# Patient Record
Sex: Male | Born: 1965 | Race: Black or African American | Hispanic: No | Marital: Single | Smoking: Never smoker
Health system: Southern US, Community
[De-identification: ages and names within clinical notes are randomized; demographics above are authoritative.]

## PROBLEM LIST (undated history)

## (undated) DIAGNOSIS — I639 Cerebral infarction, unspecified: Secondary | ICD-10-CM

## (undated) DIAGNOSIS — K259 Gastric ulcer, unspecified as acute or chronic, without hemorrhage or perforation: Secondary | ICD-10-CM

## (undated) DIAGNOSIS — L409 Psoriasis, unspecified: Secondary | ICD-10-CM

## (undated) DIAGNOSIS — M109 Gout, unspecified: Secondary | ICD-10-CM

## (undated) DIAGNOSIS — D696 Thrombocytopenia, unspecified: Secondary | ICD-10-CM

## (undated) DIAGNOSIS — I10 Essential (primary) hypertension: Secondary | ICD-10-CM

## (undated) DIAGNOSIS — M1990 Unspecified osteoarthritis, unspecified site: Secondary | ICD-10-CM

## (undated) HISTORY — PX: NO PAST SURGERIES: SHX2092

---

## 2012-08-11 ENCOUNTER — Inpatient Hospital Stay (HOSPITAL_COMMUNITY)
Admission: EM | Admit: 2012-08-11 | Discharge: 2012-08-13 | DRG: 378 | Attending: Internal Medicine | Admitting: Internal Medicine

## 2012-08-11 ENCOUNTER — Encounter (HOSPITAL_COMMUNITY): Payer: Self-pay | Admitting: *Deleted

## 2012-08-11 ENCOUNTER — Emergency Department (HOSPITAL_COMMUNITY)

## 2012-08-11 DIAGNOSIS — L408 Other psoriasis: Secondary | ICD-10-CM | POA: Diagnosis present

## 2012-08-11 DIAGNOSIS — K922 Gastrointestinal hemorrhage, unspecified: Secondary | ICD-10-CM | POA: Diagnosis present

## 2012-08-11 DIAGNOSIS — K264 Chronic or unspecified duodenal ulcer with hemorrhage: Principal | ICD-10-CM | POA: Diagnosis present

## 2012-08-11 DIAGNOSIS — K648 Other hemorrhoids: Secondary | ICD-10-CM | POA: Diagnosis present

## 2012-08-11 DIAGNOSIS — K222 Esophageal obstruction: Secondary | ICD-10-CM | POA: Diagnosis present

## 2012-08-11 DIAGNOSIS — I10 Essential (primary) hypertension: Secondary | ICD-10-CM | POA: Diagnosis present

## 2012-08-11 DIAGNOSIS — D62 Acute posthemorrhagic anemia: Secondary | ICD-10-CM | POA: Diagnosis present

## 2012-08-11 DIAGNOSIS — K254 Chronic or unspecified gastric ulcer with hemorrhage: Secondary | ICD-10-CM | POA: Diagnosis present

## 2012-08-11 DIAGNOSIS — E876 Hypokalemia: Secondary | ICD-10-CM | POA: Diagnosis present

## 2012-08-11 DIAGNOSIS — D649 Anemia, unspecified: Secondary | ICD-10-CM

## 2012-08-11 DIAGNOSIS — K921 Melena: Secondary | ICD-10-CM

## 2012-08-11 DIAGNOSIS — K5730 Diverticulosis of large intestine without perforation or abscess without bleeding: Secondary | ICD-10-CM | POA: Diagnosis present

## 2012-08-11 DIAGNOSIS — Z872 Personal history of diseases of the skin and subcutaneous tissue: Secondary | ICD-10-CM

## 2012-08-11 HISTORY — DX: Psoriasis, unspecified: L40.9

## 2012-08-11 HISTORY — DX: Essential (primary) hypertension: I10

## 2012-08-11 HISTORY — DX: Gout, unspecified: M10.9

## 2012-08-11 LAB — CBC
HCT: 27.6 % — ABNORMAL LOW (ref 39.0–52.0)
HCT: 21.1 % — ABNORMAL LOW (ref 39.0–52.0)
HCT: 21.4 % — ABNORMAL LOW (ref 39.0–52.0)
Hemoglobin: 9.3 g/dL — ABNORMAL LOW (ref 13.0–17.0)
Hemoglobin: 7.1 g/dL — ABNORMAL LOW (ref 13.0–17.0)
MCH: 25.7 pg — ABNORMAL LOW (ref 26.0–34.0)
MCH: 25.4 pg — ABNORMAL LOW (ref 26.0–34.0)
MCHC: 33.6 g/dL (ref 30.0–36.0)
MCV: 77.5 fL — ABNORMAL LOW (ref 78.0–100.0)
RBC: 3.6 MIL/uL — ABNORMAL LOW (ref 4.22–5.81)
RBC: 2.76 MIL/uL — ABNORMAL LOW (ref 4.22–5.81)
RBC: 2.76 MIL/uL — ABNORMAL LOW (ref 4.22–5.81)
WBC: 12.3 10*3/uL — ABNORMAL HIGH (ref 4.0–10.5)
WBC: 8.5 10*3/uL (ref 4.0–10.5)

## 2012-08-11 LAB — COMPREHENSIVE METABOLIC PANEL
ALT: 12 U/L (ref 0–53)
Alkaline Phosphatase: 92 U/L (ref 39–117)
CO2: 24 mEq/L (ref 19–32)
Chloride: 103 mEq/L (ref 96–112)
GFR calc Af Amer: 89 mL/min — ABNORMAL LOW (ref >90)
GFR calc non Af Amer: 76 mL/min — ABNORMAL LOW (ref >90)
Glucose, Bld: 133 mg/dL — ABNORMAL HIGH (ref 70–99)
Potassium: 3.2 mEq/L — ABNORMAL LOW (ref 3.5–5.1)
Sodium: 140 mEq/L (ref 135–145)
Total Bilirubin: 0.3 mg/dL (ref 0.3–1.2)
Total Protein: 7.8 g/dL (ref 6.0–8.3)

## 2012-08-11 LAB — FERRITIN: Ferritin: 126 ng/mL (ref 22–322)

## 2012-08-11 LAB — URINALYSIS, ROUTINE W REFLEX MICROSCOPIC
Bilirubin Urine: NEGATIVE
Ketones, ur: NEGATIVE mg/dL
Nitrite: NEGATIVE
Protein, ur: NEGATIVE mg/dL
pH: 5 (ref 5.0–8.0)

## 2012-08-11 LAB — IRON AND TIBC
Iron: 89 ug/dL (ref 42–135)
Saturation Ratios: 41 % (ref 20–55)
TIBC: 219 ug/dL (ref 215–435)
UIBC: 130 ug/dL (ref 125–400)

## 2012-08-11 LAB — RETICULOCYTES: Retic Ct Pct: 2.1 % (ref 0.4–3.1)

## 2012-08-11 MED ORDER — PANTOPRAZOLE SODIUM 40 MG IV SOLR
INTRAVENOUS | Status: AC
  Filled 2012-08-11: qty 40

## 2012-08-11 MED ORDER — HYDROCERIN EX CREA
TOPICAL_CREAM | CUTANEOUS | Status: DC | PRN
  Filled 2012-08-11: qty 113

## 2012-08-11 MED ORDER — BIOTENE DRY MOUTH MT LIQD
15.0000 mL | Freq: Two times a day (BID) | OROMUCOSAL | Status: DC
  Administered 2012-08-11 – 2012-08-13 (×5): 15 mL via OROMUCOSAL

## 2012-08-11 MED ORDER — ONDANSETRON HCL 4 MG PO TABS: 4.0000 mg | ORAL_TABLET | Freq: Four times a day (QID) | ORAL | Status: DC | PRN

## 2012-08-11 MED ORDER — SODIUM CHLORIDE 0.9 % IV SOLN: INTRAVENOUS | Status: DC

## 2012-08-11 MED ORDER — METHOTREXATE 2.5 MG PO TABS
20.0000 mg | ORAL_TABLET | ORAL | Status: DC
  Administered 2012-08-11: 20 mg via ORAL
  Filled 2012-08-11: qty 8

## 2012-08-11 MED ORDER — IOHEXOL 300 MG/ML  SOLN
100.0000 mL | Freq: Once | INTRAMUSCULAR | Status: AC | PRN
  Administered 2012-08-11: 100 mL via INTRAVENOUS

## 2012-08-11 MED ORDER — FOLIC ACID 1 MG PO TABS
1.0000 mg | ORAL_TABLET | Freq: Every day | ORAL | Status: DC
  Filled 2012-08-11: qty 1

## 2012-08-11 MED ORDER — DIPHENHYDRAMINE HCL 25 MG PO CAPS: 25.0000 mg | ORAL_CAPSULE | Freq: Every evening | ORAL | Status: DC | PRN

## 2012-08-11 MED ORDER — LISINOPRIL 10 MG PO TABS
40.0000 mg | ORAL_TABLET | Freq: Every day | ORAL | Status: DC
  Administered 2012-08-11: 40 mg via ORAL
  Filled 2012-08-11: qty 4

## 2012-08-11 MED ORDER — POTASSIUM CHLORIDE 10 MEQ/100ML IV SOLN
10.0000 meq | Freq: Once | INTRAVENOUS | Status: AC
  Administered 2012-08-11: 10 meq via INTRAVENOUS
  Filled 2012-08-11: qty 100

## 2012-08-11 MED ORDER — INFLUENZA VIRUS VACC SPLIT PF IM SUSP
0.5000 mL | INTRAMUSCULAR | Status: AC
Start: 2012-08-12 — End: 2012-08-12
  Administered 2012-08-12: 0.5 mL via INTRAMUSCULAR
  Filled 2012-08-11: qty 0.5

## 2012-08-11 MED ORDER — PANTOPRAZOLE SODIUM 40 MG IV SOLR
40.0000 mg | Freq: Once | INTRAVENOUS | Status: AC
  Administered 2012-08-11: 40 mg via INTRAVENOUS

## 2012-08-11 MED ORDER — PEG 3350-KCL-NABCB-NACL-NASULF 236 G PO SOLR
4000.0000 mL | Freq: Once | ORAL | Status: AC
  Administered 2012-08-11: 4000 mL via ORAL
  Filled 2012-08-11: qty 4000

## 2012-08-11 MED ORDER — SODIUM CHLORIDE 0.9 % IV SOLN
INTRAVENOUS | Status: DC
  Administered 2012-08-11: 1000 mL via INTRAVENOUS
  Administered 2012-08-11 – 2012-08-12 (×2): via INTRAVENOUS

## 2012-08-11 MED ORDER — FOLIC ACID 1 MG PO TABS
1.0000 mg | ORAL_TABLET | ORAL | Status: DC
  Administered 2012-08-12 – 2012-08-13 (×2): 1 mg via ORAL
  Filled 2012-08-11 (×2): qty 1

## 2012-08-11 MED ORDER — ONDANSETRON HCL 4 MG/2ML IJ SOLN: 4.0000 mg | Freq: Four times a day (QID) | INTRAMUSCULAR | Status: DC | PRN

## 2012-08-11 MED ORDER — AMLODIPINE BESYLATE 5 MG PO TABS
5.0000 mg | ORAL_TABLET | Freq: Every day | ORAL | Status: DC
  Administered 2012-08-11: 5 mg via ORAL
  Filled 2012-08-11: qty 1

## 2012-08-11 MED ORDER — TRIAMCINOLONE ACETONIDE 0.1 % EX CREA
TOPICAL_CREAM | Freq: Two times a day (BID) | CUTANEOUS | Status: DC
  Administered 2012-08-11 (×2): via TOPICAL
  Administered 2012-08-12 (×2): 1 via TOPICAL
  Administered 2012-08-13: 10:00:00 via TOPICAL
  Filled 2012-08-11: qty 15

## 2012-08-11 MED ORDER — TRIAMTERENE-HCTZ 75-50 MG PO TABS
1.0000 | ORAL_TABLET | Freq: Every day | ORAL | Status: DC
  Administered 2012-08-11: 1 via ORAL
  Filled 2012-08-11: qty 1

## 2012-08-11 MED ORDER — ONDANSETRON HCL 4 MG/2ML IJ SOLN
4.0000 mg | Freq: Once | INTRAMUSCULAR | Status: AC
  Administered 2012-08-11: 4 mg via INTRAVENOUS
  Filled 2012-08-11: qty 2

## 2012-08-11 MED ORDER — CARVEDILOL 12.5 MG PO TABS
12.5000 mg | ORAL_TABLET | Freq: Two times a day (BID) | ORAL | Status: DC
  Administered 2012-08-11 – 2012-08-13 (×3): 12.5 mg via ORAL
  Filled 2012-08-11 (×3): qty 1

## 2012-08-11 MED ORDER — HYDROMORPHONE HCL PF 1 MG/ML IJ SOLN
1.0000 mg | Freq: Once | INTRAMUSCULAR | Status: AC
  Administered 2012-08-11: 1 mg via INTRAVENOUS
  Filled 2012-08-11: qty 1

## 2012-08-11 MED ORDER — POTASSIUM CHLORIDE CRYS ER 20 MEQ PO TBCR
40.0000 meq | EXTENDED_RELEASE_TABLET | Freq: Once | ORAL | Status: AC
  Administered 2012-08-11: 40 meq via ORAL
  Filled 2012-08-11: qty 2

## 2012-08-11 MED ORDER — SODIUM CHLORIDE 0.9 % IJ SOLN
3.0000 mL | Freq: Two times a day (BID) | INTRAMUSCULAR | Status: DC
  Administered 2012-08-12 – 2012-08-13 (×2): 3 mL via INTRAVENOUS
  Filled 2012-08-11 (×3): qty 3
  Filled 2012-08-11: qty 6
  Filled 2012-08-11 (×2): qty 3

## 2012-08-11 NOTE — Progress Notes (Signed)
UR Chart Review Completed  

## 2012-08-11 NOTE — Progress Notes (Signed)
Nutrition Brief Note  Patient identified on the Malnutrition Screening Tool (MST) report for weight loss and eating poorly, generating a score of 2.  Wt Readings from Last 10 Encounters:  08/11/12 195 lb 8 oz (88.678 kg)   Pt is an inmate at [facility]. He reports that last time he weighed himself was about 8 months ago and reported a weight of 200#. He ate oysters on Friday and a burger and Ramen noodles on Saturday. He follows a regular diet and had a good appetite up until Saturday night.   Body mass index is 27.27 kg/(m^2). Pt meets criteria for overweight based on current BMI.   Current diet order is clear liquid, patient is consuming approximately 50-75% of meals at this time (per pt report). He reports good tolerance of clear liquids and is looking forward to upgrading his diet. He is scheduled for a colonoscopy tomorrow. Labs and medications reviewed.   No nutrition interventions warranted at this time. If nutrition issues arise, please consult RD.   Melody Haver, RD, LDN Pager: [phone]

## 2012-08-11 NOTE — H&P (Signed)
[facility] History and Physical  Wess Baney OZH:[id number] DOB: March 19, 1966 DOA: 08/11/2012 PCP: No primary provider on file.   Chief Complaint: Rectal bleeding.  HPI: [name] [name] is a 46 y.o. male who presents with the above symptoms for the last 2 days. Patient has been in prison for the last 22 years. He has not had these symptoms before. The rectal bleeding seems to be quite large and colored. It is associated with some left mid and lower quadrant abdominal pain. There is no nausea or vomiting. He tells me that he ate some oysters prior to this bleeding starting. He denies any significant diarrhea. There is no hematemesis. He denies the use of nonsteroidal anti-inflammatory medications. He does not drink alcohol, not allowed in prison.   Review of Systems:  All systems negative apart from history of present illness.  Past Medical History  Diagnosis Date  . Hypertension   . Gout   . Psoriasis    History reviewed. No pertinent past surgical history. Social History:  Is currently in prison. He does not smoke. Does not drink alcohol.   No Known Allergies   Prior to Admission medications   Medication Sig Start Date End Date Taking? Authorizing Provider  amLODipine (NORVASC) 5 MG tablet Take 5 mg by mouth daily.   Yes Historical Provider, MD  carvedilol (COREG) 12.5 MG tablet Take 12.5 mg by mouth 2 (two) times daily with a meal.   Yes Historical Provider, MD  diphenhydrAMINE (SOMINEX) 25 MG tablet Take 25 mg by mouth at bedtime as needed.   Yes Historical Provider, MD  folic acid (FOLVITE) 1 MG tablet Take 1 mg by mouth daily.   Yes Historical Provider, MD  lisinopril (PRINIVIL,ZESTRIL) 40 MG tablet Take 40 mg by mouth daily.   Yes Historical Provider, MD  methotrexate (RHEUMATREX) 2.5 MG tablet Take 2.5 mg by mouth once a week. Caution:Chemotherapy. Protect from light.   Yes Historical Provider, MD  Skin Protectants, Misc. (EUCERIN) cream Apply topically as needed.   Yes  Historical Provider, MD  triamcinolone cream (KENALOG) 0.1 % Apply topically 2 (two) times daily.   Yes Historical Provider, MD  triamterene-hydrochlorothiazide (MAXZIDE) 75-50 MG per tablet Take 1 tablet by mouth daily.   Yes Historical Provider, MD   Physical Exam: Filed Vitals:   08/11/12 0205 08/11/12 0457 08/11/12 0548  BP: 139/84 114/76 124/85  Pulse: 117 97 103  Temp: 97.9 F (36.6 C)  97.6 F (36.4 C)  TempSrc: Oral    Resp: 16 18 20   Height:   5\' 11"  (1.803 m)  Weight:   88.678 kg (195 lb 8 oz)  SpO2: 100% 100% 100%     General:  He looks systemically well. He does not look particularly pale. He does not look shock.  Eyes: No pallor. No jaundice.  ENT: No abnormalities.  Neck: No lymphadenopathy.  Cardiovascular: Heart sounds are present and normal without murmurs.  Respiratory: Lung fields are clear.  Abdomen: Soft, nontender, no hepatosplenomegaly.  Skin: Psoriatic lesions on his trunk.  Musculoskeletal: No abnormalities.  Psychiatric: Appropriate affect.  Neurologic: Alert and orientated without any focal neurological signs.  Labs on Admission:  Basic Metabolic Panel:  Lab 08/11/12 6295  NA 140  K 3.2*  CL 103  CO2 24  GLUCOSE 133*  BUN 39*  CREATININE 1.15  CALCIUM 9.0  MG --  PHOS --   Liver Function Tests:  Lab 08/11/12 0220  AST 18  ALT 12  ALKPHOS 92  BILITOT 0.3  PROT 7.8  ALBUMIN 3.3*     CBC:  Lab 08/11/12 0220  WBC 12.3*  NEUTROABS --  HGB 9.3*  HCT 27.6*  MCV 76.7*  PLT 261     Radiological Exams on Admission: Ct Abdomen Pelvis W Contrast  08/11/2012  *RADIOLOGY REPORT*  Clinical Data: Left side abdominal pain.  Blood in stool.  CT ABDOMEN AND PELVIS WITH CONTRAST  Technique:  Multidetector CT imaging of the abdomen and pelvis was performed following the standard protocol during bolus administration of intravenous contrast.  Contrast: OMNIPAQUE IOHEXOL 300 MG/ML  SOLN  Comparison: None.  Findings: Mild  dependent atelectasis is seen in the lung bases. There is no pleural or pericardial effusion.  A 0.6 cm low attenuating lesion in the dome of the liver is likely a cyst but cannot be definitively characterized.  The liver is otherwise unremarkable.  The gallbladder, adrenal glands, spleen, pancreas and right kidney appear normal.  Left renal cyst is noted. The left kidney otherwise appears normal.  The stomach and small and large bowel are unremarkable.  There is no lymphadenopathy or fluid.  Prostate gland, seminal vesicles and urinary bladder appear normal. 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 Central disc protrusion at L4-5 is identified.  IMPRESSION:  1.  No acute finding or finding to explain the patient's symptoms. 2.  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   Original Report Authenticated By: Bernadene Bell. Maricela Curet, M.D.       Assessment/Plan Active Problems:  GI bleed  H/O psoriasis  HTN (hypertension)   1. Rectal bleeding, unclear etiology. The microcytic anemia implies a more long-term process rather than an acute bleed. Therefore I am not convinced that his bleeding is related to eating oysters. 2. Hypertension , controlled. 3. Psoriasis on the methotrexate. 4.  Plan: 1. Admit to telemetry floor. 2. Monitor hemoglobin very closely. Patient is currently hemodynamically stable. No blood transfusion requirement at the moment unless he continues to bleed. 3. Gastrology consultation this morning with a view to colonoscopy. Further recommendations will depend on patient's hospital progress.  Code Status: Full code. Family Communication: Discussed plan with patient at the bedside. Disposition Plan: Back to prison once medically stable.  Time spent: 30 minutes.  Wilson Singer [facility] Pager [phone].  If 7PM-7AM, please contact night-coverage [email or web address] Password [facility] 08/11/2012, 7:28 AM

## 2012-08-11 NOTE — ED Provider Notes (Signed)
History     CSN: [id number]  Arrival date & time 08/11/12  4098   First MD Initiated Contact with Patient 08/11/12 0206      Chief Complaint  Patient presents with  . Rectal Bleeding  . Flank Pain  . Weakness    (Consider location/radiation/quality/duration/timing/severity/associated sxs/prior treatment) HPI HX per PT. Currently incarcerated for the last 22 years. Noticed BRRPR and today L lower ABD pain. No h/o same. Denies FB or rectal trauma. No rectal pain. No N/V. Some generalized weakness. Takes MTX for psoriasis. No change in medications, denies NSAIDs otherwise or new medications. Mod in severity, multiple stools with blood today.pain is cramping, worse with movement and not radiating, no hematuria. No F/C.  Past Medical History  Diagnosis Date  . Hypertension   . Gout     History reviewed. No pertinent past surgical history.  History reviewed. No pertinent family history.  History  Substance Use Topics  . Smoking status: Never Smoker   . Smokeless tobacco: Not on file  . Alcohol Use: No      Review of Systems  Constitutional: Negative for fever and chills.  HENT: Negative for neck pain and neck stiffness.   Eyes: Negative for pain.  Respiratory: Negative for shortness of breath.   Cardiovascular: Negative for chest pain.  Gastrointestinal: Positive for abdominal pain and blood in stool.  Genitourinary: Negative for dysuria.  Musculoskeletal: Negative for back pain.  Skin: Negative for rash.  Neurological: Negative for headaches.  All other systems reviewed and are negative.    Allergies  Review of patient's allergies indicates no known allergies.  Home Medications   Current Outpatient Rx  Name Route Sig Dispense Refill  . AMLODIPINE BESYLATE 5 MG PO TABS Oral Take 5 mg by mouth daily.    Marland Kitchen CARVEDILOL 12.5 MG PO TABS Oral Take 12.5 mg by mouth 2 (two) times daily with a meal.    . DIPHENHYDRAMINE HCL (SLEEP) 25 MG PO TABS Oral Take 25 mg by mouth  at bedtime as needed.    Marland Kitchen FOLIC ACID 1 MG PO TABS Oral Take 1 mg by mouth daily.    Marland Kitchen LISINOPRIL 40 MG PO TABS Oral Take 40 mg by mouth daily.    Marland Kitchen METHOTREXATE 2.5 MG PO TABS Oral Take 2.5 mg by mouth once a week. Caution:Chemotherapy. Protect from light.    . EUCERIN EX CREA Topical Apply topically as needed.    . TRIAMCINOLONE ACETONIDE 0.1 % EX CREA Topical Apply topically 2 (two) times daily.    . TRIAMTERENE-HCTZ 75-50 MG PO TABS Oral Take 1 tablet by mouth daily.      BP 139/84  Pulse 117  Temp 97.9 F (36.6 C) (Oral)  Resp 16  SpO2 100%  Physical Exam  Constitutional: He is oriented to person, place, and time. He appears well-developed and well-nourished.  HENT:  Head: Normocephalic and atraumatic.  Eyes: Conjunctivae normal and EOM are normal. Pupils are equal, round, and reactive to light.  Neck: Trachea normal. Neck supple. No thyromegaly present.  Cardiovascular: Normal rate, regular rhythm, S1 normal, S2 normal and normal pulses.     No systolic murmur is present   No diastolic murmur is present  Pulses:      Radial pulses are 2+ on the right side, and 2+ on the left side.  Pulmonary/Chest: Effort normal and breath sounds normal. He has no wheezes. He has no rhonchi. He has no rales. He exhibits no tenderness.  Abdominal: Soft. Normal  appearance and bowel sounds are normal. There is no tenderness. There is no CVA tenderness and negative Murphy's sign.  Genitourinary:       Rectal: nontender, no masses, maroon stool. No FB  Musculoskeletal:       BLE:s Calves nontender, no cords or erythema, negative Homans sign  Neurological: He is alert and oriented to person, place, and time. He has normal strength. No cranial nerve deficit or sensory deficit. GCS eye subscore is 4. GCS verbal subscore is 5. GCS motor subscore is 6.  Skin: Skin is warm and dry. He is not diaphoretic.       Changes of psoriasis  Psychiatric: His speech is normal.       Cooperative and appropriate      ED Course  Procedures (including critical care time)  Labs Reviewed  CBC - Abnormal; Notable for the following:    WBC 12.3 (*)     RBC 3.60 (*)     Hemoglobin 9.3 (*)     HCT 27.6 (*)     MCV 76.7 (*)     MCH 25.8 (*)     RDW 15.7 (*)     All other components within normal limits  COMPREHENSIVE METABOLIC PANEL - Abnormal; Notable for the following:    Potassium 3.2 (*)     Glucose, Bld 133 (*)     BUN 39 (*)     Albumin 3.3 (*)     GFR calc non Af Amer 76 (*)     GFR calc Af Amer 89 (*)     All other components within normal limits  URINALYSIS, ROUTINE W REFLEX MICROSCOPIC  TYPE AND SCREEN   Ct Abdomen Pelvis W Contrast  08/11/2012  *RADIOLOGY REPORT*  Clinical Data: Left side abdominal pain.  Blood in stool.  CT ABDOMEN AND PELVIS WITH CONTRAST  Technique:  Multidetector CT imaging of the abdomen and pelvis was performed following the standard protocol during bolus administration of intravenous contrast.  Contrast: OMNIPAQUE IOHEXOL 300 MG/ML  SOLN  Comparison: None.  Findings: Mild dependent atelectasis is seen in the lung bases. There is no pleural or pericardial effusion.  A 0.6 cm low attenuating lesion in the dome of the liver is likely a cyst but cannot be definitively characterized.  The liver is otherwise unremarkable.  The gallbladder, adrenal glands, spleen, pancreas and right kidney appear normal.  Left renal cyst is noted. The left kidney otherwise appears normal.  The stomach and small and large bowel are unremarkable.  There is no lymphadenopathy or fluid.  Prostate gland, seminal vesicles and urinary bladder appear normal. 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 Central disc protrusion at L4-5 is identified.  IMPRESSION:  1.  No acute finding or finding to explain the patient's symptoms. 2.  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   Original Report Authenticated By: Bernadene Bell. Maricela Curet, M.D.    Labs reviewed. T/S. IVFs.    Potassium provided for hypokalemia  4:28 AM d/w Dr Kendell Bane, uncertain follow up, low H/H, plan admit OBS and GI will see in consult.   4:46 AM d/w Dr Onalee Hua, will admit GI Bleeding. IV protonix    Plan admit. GI Bleeding  MDM   VS and nursing notes reviewed, no old records available. Labs and CT scan reviewed as above. GI c/s and MED admit. IVFs. Potassium.. Protonix.        Sunnie Nielsen, MD 08/11/12 [phone]

## 2012-08-11 NOTE — Consult Note (Signed)
Referring Provider: No ref. provider found Primary Care Physician:  No primary provider on file. Primary Gastroenterologist:  Dr. Jena Gauss  Reason for Consultation:  Rectal bleeding  HPI: [name] [name] is a 46 y.o. male admitted w/ rectal bleeding.  Hgb 9.3.  LLQ pain & bleeding after eating oysters 3 days ago.  C/o burgundy blood in stool without clots.  +large amt.  Never had this before.  No NSAIDS.   Never had a colonoscopy.  Denies any constipation, diarrhea, or weight loss.   Denies any upper GI symptoms including heartburn, indigestion, nausea, vomiting, dysphagia, odynophagia or anorexia.  Past Medical History  Diagnosis Date  . Hypertension   . Gout   . Psoriasis     History reviewed. No pertinent past surgical history.  Prior to Admission medications   Medication Sig Start Date End Date Taking? Authorizing Provider  amLODipine (NORVASC) 5 MG tablet Take 5 mg by mouth daily.   Yes Historical Provider, MD  carvedilol (COREG) 12.5 MG tablet Take 12.5 mg by mouth 2 (two) times daily with a meal.   Yes Historical Provider, MD  diphenhydrAMINE (SOMINEX) 25 MG tablet Take 25 mg by mouth at bedtime as needed.   Yes Historical Provider, MD  folic acid (FOLVITE) 1 MG tablet Take 1 mg by mouth daily.   Yes Historical Provider, MD  lisinopril (PRINIVIL,ZESTRIL) 40 MG tablet Take 40 mg by mouth daily.   Yes Historical Provider, MD  methotrexate (RHEUMATREX) 2.5 MG tablet Take 2.5 mg by mouth once a week. Caution:Chemotherapy. Protect from light.   Yes Historical Provider, MD  Skin Protectants, Misc. (EUCERIN) cream Apply topically as needed.   Yes Historical Provider, MD  triamcinolone cream (KENALOG) 0.1 % Apply topically 2 (two) times daily.   Yes Historical Provider, MD  triamterene-hydrochlorothiazide (MAXZIDE) 75-50 MG per tablet Take 1 tablet by mouth daily.   Yes Historical Provider, MD    Current Facility-Administered Medications  Medication Dose Route Frequency Provider Last Rate  Last Dose  . 0.9 %  sodium chloride infusion   Intravenous Continuous Sunnie Nielsen, MD 125 mL/hr at 08/11/12 0245    . amLODipine (NORVASC) tablet 5 mg  5 mg Oral Daily Nimish C Gosrani, MD      . antiseptic oral rinse (BIOTENE) solution 15 mL  15 mL Mouth Rinse BID Nimish C Gosrani, MD      . carvedilol (COREG) tablet 12.5 mg  12.5 mg Oral BID WC Nimish C Gosrani, MD      . diphenhydrAMINE (BENADRYL) capsule 25 mg  25 mg Oral QHS PRN Nimish C Gosrani, MD      . folic acid (FOLVITE) tablet 1 mg  1 mg Oral Daily Nimish C Gosrani, MD      . hydrocerin (EUCERIN) cream   Topical PRN Nimish Normajean Glasgow, MD      . HYDROmorphone (DILAUDID) injection 1 mg  1 mg Intravenous Once Sunnie Nielsen, MD   1 mg at 08/11/12 0245  . influenza  inactive virus vaccine (FLUZONE/FLUARIX) injection 0.5 mL  0.5 mL Intramuscular Tomorrow-1000 Nimish C Gosrani, MD      . iohexol (OMNIPAQUE) 300 MG/ML solution 100 mL  100 mL Intravenous Once PRN Medication Radiologist, MD   100 mL at 08/11/12 0331  . lisinopril (PRINIVIL,ZESTRIL) tablet 40 mg  40 mg Oral Daily Nimish C Gosrani, MD      . methotrexate (RHEUMATREX) tablet 2.5 mg  2.5 mg Oral Weekly Nimish Normajean Glasgow, MD      .  ondansetron (ZOFRAN) injection 4 mg  4 mg Intravenous Once Sunnie Nielsen, MD   4 mg at 08/11/12 0245  . ondansetron (ZOFRAN) tablet 4 mg  4 mg Oral Q6H PRN Nimish C Gosrani, MD       Or  . ondansetron (ZOFRAN) injection 4 mg  4 mg Intravenous Q6H PRN Nimish C Gosrani, MD      . pantoprazole (PROTONIX) injection 40 mg  40 mg Intravenous Once Sunnie Nielsen, MD   40 mg at 08/11/12 0453  . potassium chloride 10 mEq in 100 mL IVPB  10 mEq Intravenous Once Sunnie Nielsen, MD 100 mL/hr at 08/11/12 0453 10 mEq at 08/11/12 0453  . potassium chloride SA (K-DUR,KLOR-CON) CR tablet 40 mEq  40 mEq Oral Once Nimish C Gosrani, MD      . sodium chloride 0.9 % injection 3 mL  3 mL Intravenous Q12H Nimish C Gosrani, MD      . triamcinolone cream (KENALOG) 0.1 %   Topical BID Nimish  C Gosrani, MD      . triamterene-hydrochlorothiazide (MAXZIDE) 75-50 MG per tablet 1 tablet  1 tablet Oral Daily Wilson Singer, MD        Allergies as of 08/11/2012  . (No Known Allergies)    History reviewed. No pertinent family history.  History   Social History  . Marital Status: Single    Spouse Name: N/A    Number of Children: N/A  . Years of Education: N/A   Occupational History  . Not on file.   Social History Main Topics  . Smoking status: Never Smoker   . Smokeless tobacco: Not on file  . Alcohol Use: No  . Drug Use: No  . Sexually Active:   Review of Systems: Gen: Denies any fever, chills, sweats, anorexia, fatigue, weakness, malaise, weight loss, and sleep disorder CV: Denies chest pain, angina, palpitations, syncope, orthopnea, PND, peripheral edema, and claudication. Resp: Denies dyspnea at rest, dyspnea with exercise, cough, sputum, wheezing, coughing up blood, and pleurisy. GI: Denies vomiting blood, jaundice, and fecal incontinence.   Denies dysphagia or odynophagia. GU : Denies urinary burning, blood in urine, urinary frequency, urinary hesitancy, nocturnal urination, and urinary incontinence. MS: Denies joint pain, limitation of movement, and swelling, stiffness, low back pain, extremity pain. Denies muscle weakness, cramps, atrophy.  Derm: Denies rash, itching, dry skin, hives, moles, warts, or unhealing ulcers.  Psych: Denies depression, anxiety, memory loss, suicidal ideation, hallucinations, paranoia, and confusion. Heme: Denies bruising or enlarged lymph nodes. Neuro:  Denies any headaches, dizziness, paresthesias. Endo:  Denies any problems with DM, thyroid, adrenal function.  Physical Exam: Vital signs in last 24 hours: Temp:  [97.6 F (36.4 C)-97.9 F (36.6 C)] 97.6 F (36.4 C) (09/16 0548) Pulse Rate:  [97-117] 103  (09/16 0548) Resp:  [16-20] 20  (09/16 0548) BP: (114-139)/(76-85) 124/85 mmHg (09/16 0548) SpO2:  [100 %] 100 % (09/16  0548) Weight:  [195 lb 8 oz (88.678 kg)] 195 lb 8 oz (88.678 kg) (09/16 0548) Last BM Date: 08/08/12 No LMP for male patient. General:   Alert,  Well-developed, well-nourished, pleasant and cooperative in NAD.  2 officers at bedside. Head:  Normocephalic and atraumatic. Eyes:  Sclera clear, no icterus.   Conjunctiva pink.  Ears:  Normal auditory acuity. Nose:  No deformity, discharge, or lesions. Mouth:  No deformity or lesions,oropharynx pink & moist. Neck:  Supple; no masses or thyromegaly. Lungs:  Clear throughout to auscultation.   No wheezes, crackles, or rhonchi.  No acute distress. Heart:  Regular rate and rhythm; no murmurs, clicks, rubs,  or gallops. Abdomen:  Normal bowel sounds.  No bruits.  Soft, non-tender and non-distended without masses, hepatosplenomegaly or hernias noted.  No guarding or rebound tenderness.   Rectal:  Deferred.  Msk:  Symmetrical without gross deformities. Normal posture. Pulses:  Normal pulses noted. Extremities:  No clubbing or edema. Neurologic:  Alert and oriented x4;  grossly normal neurologically. Skin:  Intact without significant lesions or rashes. Lymph Nodes:  No significant cervical adenopathy. Psych:  Alert and cooperative. Normal mood and affect.  Intake/Output from previous day: 09/15 0701 - 09/16 0700 In: 560.4 [I.V.:460.4; IV Piggyback:100] Out: 200 [Urine:200] Intake/Output this shift:    Lab Results:  Basename 08/11/12 0220  WBC 12.3*  HGB 9.3*  HCT 27.6*  PLT 261   BMET  Basename 08/11/12 0220  NA 140  K 3.2*  CL 103  CO2 24  GLUCOSE 133*  BUN 39*  CREATININE 1.15  CALCIUM 9.0   LFT  Basename 08/11/12 0220  PROT 7.8  ALBUMIN 3.3*  AST 18  ALT 12  ALKPHOS 92  BILITOT 0.3  BILIDIR --  IBILI --  LIPASE --  AMYLASE --   Studies/Results: Ct Abdomen Pelvis W Contrast  08/11/2012  *RADIOLOGY REPORT*  Clinical Data: Left side abdominal pain.  Blood in stool.  CT ABDOMEN AND PELVIS WITH CONTRAST  Technique:   Multidetector CT imaging of the abdomen and pelvis was performed following the standard protocol during bolus administration of intravenous contrast.  Contrast: OMNIPAQUE IOHEXOL 300 MG/ML  SOLN  Comparison: None.  Findings: Mild dependent atelectasis is seen in the lung bases. There is no pleural or pericardial effusion.  A 0.6 cm low attenuating lesion in the dome of the liver is likely a cyst but cannot be definitively characterized.  The liver is otherwise unremarkable.  The gallbladder, adrenal glands, spleen, pancreas and right kidney appear normal.  Left renal cyst is noted. The left kidney otherwise appears normal.  The stomach and small and large bowel are unremarkable.  There is no lymphadenopathy or fluid.  Prostate gland, seminal vesicles and urinary bladder appear normal. 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 Central disc protrusion at L4-5 is identified.  IMPRESSION:  1.  No acute finding or finding to explain the patient's symptoms. 2.  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   Original Report Authenticated By: Bernadene Bell. Maricela Curet, M.D.     Impression: Amere Westrope is a pleasant 46 y.o. male admitted w/ hematochezia & anemia.  Colonoscopy as soon as possible to determine etiology.  Differentials include colorectal ca, diverticular bleeding, ischemia or benign anorectal source.  Ct was benign.  I have discussed risks & benefits which include, but are not limited to, bleeding, infection, perforation & drug reaction.  The patient agrees with this plan & written consent will be obtained.    Hypokalemia being addressed by attending.  Plan: 1. Colonoscopy tomorrow. 2. Standard prep 3. NPO after MN 4. FU CBC   LOS: 0 days   Lorenza Burton  08/11/2012, 8:27 AM [facility]   As above. Fused SI joints. Differential would include new onset inflammatory bowel disease.

## 2012-08-11 NOTE — ED Notes (Signed)
Pt c/o blood in stool since Friday, left sided flank pain, and weakness.

## 2012-08-12 ENCOUNTER — Encounter (HOSPITAL_COMMUNITY): Payer: Self-pay | Admitting: *Deleted

## 2012-08-12 ENCOUNTER — Encounter (HOSPITAL_COMMUNITY): Admission: EM | Payer: Self-pay | Source: Home / Self Care | Attending: Internal Medicine

## 2012-08-12 DIAGNOSIS — D649 Anemia, unspecified: Secondary | ICD-10-CM

## 2012-08-12 DIAGNOSIS — K625 Hemorrhage of anus and rectum: Secondary | ICD-10-CM

## 2012-08-12 DIAGNOSIS — K259 Gastric ulcer, unspecified as acute or chronic, without hemorrhage or perforation: Secondary | ICD-10-CM

## 2012-08-12 DIAGNOSIS — K648 Other hemorrhoids: Secondary | ICD-10-CM

## 2012-08-12 DIAGNOSIS — K5730 Diverticulosis of large intestine without perforation or abscess without bleeding: Secondary | ICD-10-CM

## 2012-08-12 DIAGNOSIS — K921 Melena: Secondary | ICD-10-CM

## 2012-08-12 HISTORY — PX: COLONOSCOPY: SHX5424

## 2012-08-12 HISTORY — PX: ESOPHAGOGASTRODUODENOSCOPY: SHX5428

## 2012-08-12 LAB — COMPREHENSIVE METABOLIC PANEL
ALT: 18 U/L (ref 0–53)
BUN: 15 mg/dL (ref 6–23)
CO2: 27 mEq/L (ref 19–32)
Calcium: 8.7 mg/dL (ref 8.4–10.5)
GFR calc Af Amer: >90 mL/min (ref >90)
GFR calc non Af Amer: 82 mL/min — ABNORMAL LOW (ref >90)
Glucose, Bld: 107 mg/dL — ABNORMAL HIGH (ref 70–99)
Sodium: 140 mEq/L (ref 135–145)

## 2012-08-12 LAB — CBC
Hemoglobin: 8.8 g/dL — ABNORMAL LOW (ref 13.0–17.0)
MCHC: 33.6 g/dL (ref 30.0–36.0)
RDW: 16.2 % — ABNORMAL HIGH (ref 11.5–15.5)
WBC: 8.7 10*3/uL (ref 4.0–10.5)

## 2012-08-12 LAB — PREPARE RBC (CROSSMATCH)

## 2012-08-12 SURGERY — COLONOSCOPY
Anesthesia: Moderate Sedation

## 2012-08-12 MED ORDER — PANTOPRAZOLE SODIUM 40 MG PO TBEC
40.0000 mg | DELAYED_RELEASE_TABLET | Freq: Two times a day (BID) | ORAL | Status: DC
  Administered 2012-08-12 – 2012-08-13 (×2): 40 mg via ORAL
  Filled 2012-08-12 (×2): qty 1

## 2012-08-12 MED ORDER — MEPERIDINE HCL 100 MG/ML IJ SOLN
INTRAMUSCULAR | Status: AC
  Filled 2012-08-12: qty 2

## 2012-08-12 MED ORDER — PANTOPRAZOLE SODIUM 40 MG PO TBEC: 40.0000 mg | DELAYED_RELEASE_TABLET | Freq: Every day | ORAL | Status: DC

## 2012-08-12 MED ORDER — BUTAMBEN-TETRACAINE-BENZOCAINE 2-2-14 % EX AERO
INHALATION_SPRAY | CUTANEOUS | Status: DC | PRN
  Administered 2012-08-12: 2 via TOPICAL

## 2012-08-12 MED ORDER — SODIUM CHLORIDE 0.9 % IJ SOLN
INTRAMUSCULAR | Status: AC
  Administered 2012-08-12: 08:00:00
  Filled 2012-08-12: qty 3

## 2012-08-12 MED ORDER — MIDAZOLAM HCL 5 MG/5ML IJ SOLN
INTRAMUSCULAR | Status: DC | PRN
  Administered 2012-08-12 (×2): 2 mg via INTRAVENOUS

## 2012-08-12 MED ORDER — MIDAZOLAM HCL 5 MG/5ML IJ SOLN
INTRAMUSCULAR | Status: AC
  Filled 2012-08-12: qty 10

## 2012-08-12 MED ORDER — STERILE WATER FOR IRRIGATION IR SOLN
Status: DC | PRN
  Administered 2012-08-12: 11:00:00

## 2012-08-12 MED ORDER — LISINOPRIL 10 MG PO TABS
40.0000 mg | ORAL_TABLET | Freq: Every day | ORAL | Status: DC
  Administered 2012-08-13: 40 mg via ORAL
  Filled 2012-08-12: qty 4

## 2012-08-12 MED ORDER — AMLODIPINE BESYLATE 5 MG PO TABS
5.0000 mg | ORAL_TABLET | Freq: Every day | ORAL | Status: DC
  Administered 2012-08-13: 5 mg via ORAL
  Filled 2012-08-12: qty 1

## 2012-08-12 MED ORDER — SODIUM CHLORIDE 0.9 % IJ SOLN
INTRAMUSCULAR | Status: AC
  Administered 2012-08-12: 01:00:00
  Filled 2012-08-12: qty 3

## 2012-08-12 MED ORDER — TRIAMTERENE-HCTZ 75-50 MG PO TABS
1.0000 | ORAL_TABLET | Freq: Every day | ORAL | Status: DC
Start: 2012-08-14 — End: 2012-08-13

## 2012-08-12 MED ORDER — ACETAMINOPHEN 325 MG PO TABS
650.0000 mg | ORAL_TABLET | Freq: Four times a day (QID) | ORAL | Status: DC | PRN
  Administered 2012-08-12 – 2012-08-13 (×2): 650 mg via ORAL
  Filled 2012-08-12 (×2): qty 2

## 2012-08-12 MED ORDER — MEPERIDINE HCL 100 MG/ML IJ SOLN
INTRAMUSCULAR | Status: DC | PRN
  Administered 2012-08-12: 50 mg via INTRAVENOUS
  Administered 2012-08-12: 25 mg via INTRAVENOUS

## 2012-08-12 NOTE — Progress Notes (Signed)
     Subjective: This man appears to have reduced in his rectal bleeding. He was seen by-oncology, Dr. Kendell Bane who is going to do colonoscopy today. He did require 2 units blood transfusion yesterday when his hemoglobin dropped to 7. He does feel better after receiving blood transfusion.           Physical Exam: Blood pressure 94/58, pulse 85, temperature 98.3 F (36.8 C), temperature source Oral, resp. rate 18, height 5\' 11"  (1.803 m), weight 88.678 kg (195 lb 8 oz), SpO2 98.00%. Looks systemically well. He does not look pale. There are no new physical signs.   Investigations:     Basic Metabolic Panel:  Basename 08/12/12 0521 08/11/12 0220  NA 140 140  K 3.9 3.2*  CL 107 103  CO2 27 24  GLUCOSE 107* 133*  BUN 15 39*  CREATININE 1.08 1.15  CALCIUM 8.7 9.0  MG -- --  PHOS -- --   Liver Function Tests:  [facility] 08/12/12 0521 08/11/12 0220  AST 23 18  ALT 18 12  ALKPHOS 79 92  BILITOT 0.8 0.3  PROT 6.3 7.8  ALBUMIN 2.7* 3.3*     CBC:  Basename 08/12/12 0521 08/11/12 1500  WBC 8.7 8.5  NEUTROABS -- --  HGB 8.8* 7.0*  HCT 26.2* 21.4*  MCV 78.9 77.5*  PLT 154 188    Ct Abdomen Pelvis W Contrast  08/11/2012  *RADIOLOGY REPORT*  Clinical Data: Left side abdominal pain.  Blood in stool.  CT ABDOMEN AND PELVIS WITH CONTRAST  Technique:  Multidetector CT imaging of the abdomen and pelvis was performed following the standard protocol during bolus administration of intravenous contrast.  Contrast: OMNIPAQUE IOHEXOL 300 MG/ML  SOLN  Comparison: None.  Findings: Mild dependent atelectasis is seen in the lung bases. There is no pleural or pericardial effusion.  A 0.6 cm low attenuating lesion in the dome of the liver is likely a cyst but cannot be definitively characterized.  The liver is otherwise unremarkable.  The gallbladder, adrenal glands, spleen, pancreas and right kidney appear normal.  Left renal cyst is noted. The left kidney otherwise appears normal.   The stomach and small and large bowel are unremarkable.  There is no lymphadenopathy or fluid.  Prostate gland, seminal vesicles and urinary bladder appear normal. 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 Central disc protrusion at L4-5 is identified.  IMPRESSION:  1.  No acute finding or finding to explain the patient's symptoms. 2.  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   Original Report Authenticated By: Bernadene Bell. Maricela Curet, M.D.       Medications: I have reviewed the patient's current medications.  Impression: 1. Rectal bleeding, status post 2 units blood transfusion. Hemoglobin only 8.8 now. 2. Hypertension, controlled. 3. Psoriasis.     Plan: 1. Give further 2 units of blood. 2. Colonoscopy today. 3. If is stable and depending on findings from colonoscopy, consider [facility] back to [facility] tomorrow.     LOS: 1 day   Wilson Singer Pager [phone]  08/12/2012, 8:19 AM

## 2012-08-12 NOTE — Progress Notes (Signed)
Notified MD of pt complaining of headache rating 5/10.

## 2012-08-12 NOTE — Op Note (Signed)
[facility] [address] [place], [address]   COLONOSCOPY PROCEDURE REPORT  PATIENT: [name] [name], [name] [name]  MR#: [id number] BIRTHDATE: 10-18-1966 , 45  yrs. old GENDER: Male ENDOSCOPIST: Jonette Eva, MD REFERRED XB:JYNWGN Karilyn Cota, M.D. PROCEDURE DATE:  08/12/2012 PROCEDURE:   ILEOColonoscopy, diagnostic INDICATIONS:PAINLESS rectal bleeding-2G DROP IN HB. MEDICATIONS: Demerol 75 mg IV and Versed 4 mg IV  DESCRIPTION OF PROCEDURE:    Physical exam was performed.  Informed consent was obtained from the patient after explaining the benefits, risks, and alternatives to procedure.  The patient was connected to monitor and placed in left lateral position. Continuous oxygen was provided by nasal cannula and IV medicine administered through an indwelling cannula.  After administration of sedation and rectal exam, the patients rectum was intubated and the Pentax Colonoscope [id number]  colonoscope was advanced under direct visualization to the ileum.  The scope was removed slowly by carefully examining the color, texture, anatomy, and integrity mucosa on the way out.  The patient was recovered in endoscopy and discharged home in satisfactory condition.       COLON FINDINGS: The mucosa appeared normal in the terminal ileum.  , Moderate diverticulosis was noted in the ascending colon and proximal transverse colon.  , The colon mucosa was otherwise normal.  , and Small internal hemorrhoids were found.  PREP QUALITY: excellent. CECAL W/D TIME: 11 minutes  COMPLICATIONS: None  ENDOSCOPIC IMPRESSION: 1.   Normal mucosa in the terminal ileum 2.   Moderate diverticulosis was noted in the ascending colon and proximal transverse colon 3.   The colon mucosa was otherwise normal 4.   Small internal hemorrhoids 5.   NO OBVIOUS SOURCE FOR BRBRPR/ANEMIA IDENTIFIED   RECOMMENDATIONS:  PROCEED TO EGD     _______________________________ eSignedJonette Eva, MD 08/12/2012  11:40 AM

## 2012-08-12 NOTE — H&P (Signed)
  Primary Care Physician:  No primary provider on file. Primary Gastroenterologist:  Dr. Darrick Penna  Pre-Procedure History & Physical: HPI:  [name] [name] is a 46 y.o. male here for Anemia/BRBPR.  Past Medical History  Diagnosis Date  . Hypertension   . Gout   . Psoriasis     Past Surgical History  Procedure Date  . No past surgeries     Prior to Admission medications   Medication Sig Start Date End Date Taking? Authorizing Provider  amLODipine (NORVASC) 5 MG tablet Take 5 mg by mouth daily.   Yes Historical Provider, MD  carvedilol (COREG) 12.5 MG tablet Take 12.5 mg by mouth 2 (two) times daily with a meal.   Yes Historical Provider, MD  diphenhydrAMINE (SOMINEX) 25 MG tablet Take 25 mg by mouth at bedtime as needed.   Yes Historical Provider, MD  folic acid (FOLVITE) 1 MG tablet Take 1 mg by mouth See admin instructions. Daily except on MTX administration day (Monday)   Yes Historical Provider, MD  lisinopril (PRINIVIL,ZESTRIL) 40 MG tablet Take 40 mg by mouth daily.   Yes Historical Provider, MD  methotrexate (RHEUMATREX) 2.5 MG tablet Take 20 mg by mouth once a week. Patient takes 8 tabs (20 mg) on Monday. Caution:Chemotherapy. Protect from light.   Yes Historical Provider, MD  Skin Protectants, Misc. (EUCERIN) cream Apply topically as needed.   Yes Historical Provider, MD  triamterene-hydrochlorothiazide (MAXZIDE) 75-50 MG per tablet Take 1 tablet by mouth daily.   Yes Historical Provider, MD    Allergies as of 08/11/2012  . (No Known Allergies)    Family History  Problem Relation Age of Onset  . Colon polyps Neg Hx   . Colon cancer Neg Hx     History   Social History  . Marital Status: Single    Spouse Name: N/A    Number of Children: 1  . Years of Education: N/A   Occupational History  . Not on file.   Social History Main Topics  . Smoking status: Never Smoker   . Smokeless tobacco: Not on file  . Alcohol Use: No  . Drug Use: No     none since 24 yrs  .  Sexually Active: No   Other Topics Concern  . Not on file   Social History Narrative   Lives in [facility] yrs 1 son 69    Review of Systems: See HPI, otherwise negative ROS   Physical Exam: BP 117/83  Pulse 85  Temp 97.7 F (36.5 C) (Oral)  Resp 17  Ht 5\' 11"  (1.803 m)  Wt 195 lb 8 oz (88.678 kg)  BMI 27.27 kg/m2  SpO2 99% General:   Alert,  pleasant and cooperative in NAD Head:  Normocephalic and atraumatic. Neck:  Supple; Lungs:  Clear throughout to auscultation.    Heart:  Regular rate and rhythm. Abdomen:  Soft, nontender and nondistended. Normal bowel sounds, without guarding, and without rebound.   Neurologic:  Alert and  oriented x4;  grossly normal neurologically.  Impression/Plan:     Anemia/BRBPR  PLAN:  1. TCS/?EGD TODAY

## 2012-08-12 NOTE — Op Note (Signed)
[facility] [address] [place], [address]   ENDOSCOPY PROCEDURE REPORT  PATIENT: [name] [name]  MR#: [id number] BIRTHDATE: Aug 26, 1966 , 45  yrs. old GENDER: Male  ENDOSCOPIST: Jonette Eva, MD  1O GI MD:  DR. Jena Gauss REFERRED QM:VHQION Karilyn Cota, M.D.  PROCEDURE DATE: 08/12/2012 PROCEDURE:   EGD w/ biopsy  INDICATIONS:anemia, hematochezia-?melena when he wiped X1.  NO ASA OR NSAIDS. MEDICATIONS:    TCS + NONE TOPICAL ANESTHETIC:   Cetacaine Spray  DESCRIPTION OF PROCEDURE:     Physical exam was performed.  Informed consent was obtained from the patient after explaining the benefits, risks, and alternatives to the procedure.  The patient was connected to the monitor and placed in the left lateral position.  Continuous oxygen was provided by nasal cannula and IV medicine administered through an indwelling cannula.  After administration of sedation, the patients esophagus was intubated and the [id number] ([id number]  endoscope was advanced under direct visualization to the second portion of the duodenum.  The scope was removed slowly by carefully examining the color, texture, anatomy, and integrity of the mucosa on the way out.  The patient was recovered in endoscopy and discharged home in satisfactory condition.      ESOPHAGUS: PATENT ESOPHAGEAL STRICTURE.   The esophagus was otherwise normal.  STOMACH: Four ulcers ranging between 3-5 mm in size were found in the gastric antrum.  Biopsies were taken at edge of the ulcers (6) and at the center of the ulcers (2).  DUODENUM: Two ulcers ranging between 3-32mm in size were found in the JUNCTION OF D1/D2.  COMPLICATIONS:   None  ENDOSCOPIC IMPRESSION: 1.   PATENT ESOPHAGEAL STRICTURE 2.   The esophagus was otherwise normal. 3.   Four ulcers ranging between 3-5 mm in size were found in the gastric antrum 4.   Two ulcers ranging between 3-54mm in size were found in the duodenal bulb 5.   ANEMIA/?BRBPR DUE  TO PUD, DOUBT RIGHT SIDED DIVERTICULAR BLEED  RECOMMENDATIONS: 1.  await biopsy results from LARGEST GASTRIC ULCER 2.  PPI bid 3.  LOW FAT HIGH FIBER DIET REPEAT EGD IN 3 MOS TO ASSESS HEALING.  ONLY 1 OF 4 GASTRIC ULCERS BIOPSIED.   REPEAT EXAM:   _______________________________ Rosalie DoctorJonette Eva, MD 08/12/2012 12:10 PM       PATIENT NAME:  [name], [name] MR#: [id number]

## 2012-08-12 NOTE — Progress Notes (Signed)
2 Units of PRBCs given to pt without any complications. Sheryn Bison

## 2012-08-13 ENCOUNTER — Telehealth: Payer: Self-pay | Admitting: Gastroenterology

## 2012-08-13 DIAGNOSIS — E876 Hypokalemia: Secondary | ICD-10-CM

## 2012-08-13 DIAGNOSIS — K259 Gastric ulcer, unspecified as acute or chronic, without hemorrhage or perforation: Secondary | ICD-10-CM

## 2012-08-13 DIAGNOSIS — D649 Anemia, unspecified: Secondary | ICD-10-CM

## 2012-08-13 LAB — TYPE AND SCREEN
Unit division: 0
Unit division: 0

## 2012-08-13 LAB — CBC
Platelets: 143 10*3/uL — ABNORMAL LOW (ref 150–400)
RBC: 3.66 MIL/uL — ABNORMAL LOW (ref 4.22–5.81)
RDW: 15.8 % — ABNORMAL HIGH (ref 11.5–15.5)
WBC: 7.3 10*3/uL (ref 4.0–10.5)

## 2012-08-13 LAB — COMPREHENSIVE METABOLIC PANEL
ALT: 17 U/L (ref 0–53)
AST: 21 U/L (ref 0–37)
Albumin: 2.6 g/dL — ABNORMAL LOW (ref 3.5–5.2)
Alkaline Phosphatase: 85 U/L (ref 39–117)
CO2: 26 mEq/L (ref 19–32)
Chloride: 106 mEq/L (ref 96–112)
Potassium: 3.9 mEq/L (ref 3.5–5.1)
Total Bilirubin: 0.5 mg/dL (ref 0.3–1.2)

## 2012-08-13 MED ORDER — SODIUM CHLORIDE 0.9 % IJ SOLN
INTRAMUSCULAR | Status: AC
  Administered 2012-08-13: 3 mL
  Filled 2012-08-13: qty 3

## 2012-08-13 MED ORDER — PANTOPRAZOLE SODIUM 40 MG PO TBEC: 40.0000 mg | DELAYED_RELEASE_TABLET | Freq: Two times a day (BID) | ORAL | Status: AC

## 2012-08-13 NOTE — Discharge Summary (Addendum)
Physician Discharge Summary  [name] [name] ZOX:[id number] DOB: [birth year] DOA: 08/11/2012  PCP: No primary provider on file.  Admit date: 08/11/2012 Discharge date: 08/13/2012  Recommendations for Outpatient Follow-up:  1. Patient will need repeat upper endoscopy in 3 months to re assess gastric ulcers 2. Follow up gastric ulcer biopsies 3. Follow up with primary doctor in 2 weeks  Discharge Diagnoses:  Active Problems:  GI bleed  H/O psoriasis  HTN (hypertension) Acute Blood Loss Anemia, s/p 4 units of prbc  Discharge Condition: improved  Diet recommendation: low salt, low fat, high fiber  Filed Weights   08/11/12 0548  Weight: 88.678 kg (195 lb 8 oz)    History of present illness:  [name] [name] is a 46 y.o. male who presents with rectal bleeding for the last 2 days. Patient has been in prison for the last 22 years. He has not had these symptoms before. The rectal bleeding seems to be quite large and colored. It is associated with some left mid and lower quadrant abdominal pain. There is no nausea or vomiting. He tells me that he ate some oysters prior to this bleeding starting. He denies any significant diarrhea. There is no hematemesis. He denies the use of nonsteroidal anti-inflammatory medications. He does not drink alcohol, not allowed in prison.   Hospital Course:  The gentleman was admitted to the hospital with complaints of rectal bleeding. He underwent colonoscopy with Dr. Darrick Penna which showed normal mucosa in the terminal ileum, moderate diverticulosis in the ascending colon and proximal transverse colon, colon mucosa was otherwise normal. Small internal hemorrhoids were also noted. He subsequently underwent upper endoscopy which showed a patent esophageal stricture, 4 ulcers ranging between 3-5 mm in size were found in the gastric antrum, 2 ulcers ranging between 3-7 mm were found in the duodenal bulb. Biopsies were taken from the largest gastric ulcer which will need to be  followed up. Proton pump inhibitors twice a day were recommended as well as low fat, high fiber diet. He will need in repeat upper endoscopy in 3 months to assess healing. Patient was transfused a total of 4 units of PRBCs during his hospital stay. His hemoglobin is currently stable at 10. He is otherwise in his usual health and is felt stable for discharge. He'll be discharged back to the correctional facility today.  Procedures:  Colonoscopy  EGD  Consultations:  Gastroenterology, Dr. Darrick Penna  Discharge Exam: Filed Vitals:   08/12/12 1754 08/12/12 1859 08/12/12 2054 08/13/12 0629  BP: 110/66 113/70 114/69 127/77  Pulse: 80 79 81 80  Temp: 97.6 F (36.4 C) 97.8 F (36.6 C) 99.1 F (37.3 C) 98.4 F (36.9 C)  TempSrc: Oral Oral Axillary Oral  Resp: 20 20 20 20   Height:      Weight:      SpO2:   100% 100%    General: NAD Cardiovascular: s1, s2, rrr Respiratory: cta b  Discharge Instructions  Discharge Orders    Future Orders Please Complete By Expires   Diet - low sodium heart healthy      Increase activity slowly          Medication List     As of 08/13/2012 11:01 AM    TAKE these medications         amLODipine 5 MG tablet   Commonly known as: NORVASC   Take 5 mg by mouth daily.      carvedilol 12.5 MG tablet   Commonly known as: COREG   Take 12.5  mg by mouth 2 (two) times daily with a meal.      diphenhydrAMINE 25 MG tablet   Commonly known as: SOMINEX   Take 25 mg by mouth at bedtime as needed.      eucerin cream   Apply topically as needed.      folic acid 1 MG tablet   Commonly known as: FOLVITE   Take 1 mg by mouth See admin instructions. Daily except on MTX administration day (Monday)      lisinopril 40 MG tablet   Commonly known as: PRINIVIL,ZESTRIL   Take 40 mg by mouth daily.      methotrexate 2.5 MG tablet   Commonly known as: RHEUMATREX   Take 20 mg by mouth once a week. Patient takes 8 tabs (20 mg) on Monday. Caution:Chemotherapy.  Protect from light.      pantoprazole 40 MG tablet   Commonly known as: PROTONIX   Take 1 tablet (40 mg total) by mouth 2 (two) times daily before a meal.      triamterene-hydrochlorothiazide 75-50 MG per tablet   Commonly known as: MAXZIDE   Take 1 tablet by mouth daily.           Follow-up Information    Please follow up. (will need repeat upper endsocopy in 3 months to re assess ulcers)           The results of significant diagnostics from this hospitalization (including imaging, microbiology, ancillary and laboratory) are listed below for reference.    Significant Diagnostic Studies: Ct Abdomen Pelvis W Contrast  08/11/2012  *RADIOLOGY REPORT*  Clinical Data: Left side abdominal pain.  Blood in stool.  CT ABDOMEN AND PELVIS WITH CONTRAST  Technique:  Multidetector CT imaging of the abdomen and pelvis was performed following the standard protocol during bolus administration of intravenous contrast.  Contrast: OMNIPAQUE IOHEXOL 300 MG/ML  SOLN  Comparison: None.  Findings: Mild dependent atelectasis is seen in the lung bases. There is no pleural or pericardial effusion.  A 0.6 cm low attenuating lesion in the dome of the liver is likely a cyst but cannot be definitively characterized.  The liver is otherwise unremarkable.  The gallbladder, adrenal glands, spleen, pancreas and right kidney appear normal.  Left renal cyst is noted. The left kidney otherwise appears normal.  The stomach and small and large bowel are unremarkable.  There is no lymphadenopathy or fluid.  Prostate gland, seminal vesicles and urinary bladder appear normal. 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 Central disc protrusion at L4-5 is identified.  IMPRESSION:  1.  No acute finding or finding to explain the patient's symptoms. 2.  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   Original Report Authenticated By: Bernadene Bell. Maricela Curet, M.D.     Microbiology: No results found for this or  any previous visit (from the past 240 hour(s)).   Labs: Basic Metabolic Panel:  Lab 08/13/12 4540 08/12/12 0521 08/11/12 0220  NA 140 140 140  K 3.9 3.9 3.2*  CL 106 107 103  CO2 26 27 24   GLUCOSE 105* 107* 133*  BUN 12 15 39*  CREATININE 1.16 1.08 1.15  CALCIUM 8.3* 8.7 9.0  MG -- -- --  PHOS -- -- --   Liver Function Tests:  Lab 08/13/12 0545 08/12/12 0521 08/11/12 0220  AST 21 23 18   ALT 17 18 12   ALKPHOS 85 79 92  BILITOT 0.5 0.8 0.3  PROT 6.1 6.3 7.8  ALBUMIN 2.6*  2.7* 3.3*   No results found for this basename: LIPASE:5,AMYLASE:5 in the last 168 hours No results found for this basename: AMMONIA:5 in the last 168 hours CBC:  Lab 08/13/12 0545 08/12/12 0521 08/11/12 1500 08/11/12 0832 08/11/12 0220  WBC 7.3 8.7 8.5 8.2 12.3*  NEUTROABS -- -- -- -- --  HGB 10.0* 8.8* 7.0* 7.1* 9.3*  HCT 29.0* 26.2* 21.4* 21.1* 27.6*  MCV 79.2 78.9 77.5* 76.4* 76.7*  PLT 143* 154 188 169 261   Cardiac Enzymes: No results found for this basename: CKTOTAL:5,CKMB:5,CKMBINDEX:5,TROPONINI:5 in the last 168 hours BNP: BNP (last 3 results) No results found for this basename: PROBNP:3 in the last 8760 hours CBG: No results found for this basename: GLUCAP:5 in the last 168 hours  Time coordinating discharge: greater than 30 minutes  Signed:  MEMON,JEHANZEB  [facility] 08/13/2012, 11:01 AM

## 2012-08-13 NOTE — Progress Notes (Signed)
08/13/12 1400 Patient discharged back to correctional facility this afternoon. Called report to Donnita Falls, correctional facility nurse this morning. Discussed discharge instructions and prescription being sent with patient, stated to place in discharge packet and give to guards for transport. Stated okay for patient to return to facility and would notify facility personnel. Notified guards of report given to nurse as requested. Reviewed discharge instructions with patient, verbalized understanding of instructions and to notify nurse of any new/worsening symptoms. IV site d/c'd and within normal limits. Left floor in stable condition via w/c accompanied by two guards and nurse tech. Riccardo Dubin

## 2012-08-13 NOTE — Progress Notes (Signed)
Subjective: LUQ about same, feels it more when he moves. EGD/TCS yesterday. Tolerating diet. No N/V. No BM since colonoscopy. No further evidence of acute GI bleeding. Status post 2 u PRBCs yesterday with improvement to 10 from 8.8. Denies weakness, fatigue.   Objective: Vital signs in last 24 hours: Temp:  [97 F (36.1 C)-99.1 F (37.3 C)] 98.4 F (36.9 C) (09/18 0629) Pulse Rate:  [71-82] 80  (09/18 0629) Resp:  [12-20] 20  (09/18 0629) BP: (69-127)/(38-83) 127/77 mmHg (09/18 0629) SpO2:  [99 %-100 %] 100 % (09/18 0629) Last BM Date: 08/11/12 General:   Alert and oriented, pleasant Head:  Normocephalic and atraumatic. Eyes:  No icterus, sclera clear. Conjuctiva pink.  Heart:  S1, S2 present, no murmurs noted.  Lungs: Clear to auscultation bilaterally, without wheezing, rales, or rhonchi.  Abdomen:  Bowel sounds present, soft, mild TTP LUQ/left costal margin, non-distended. No HSM or hernias noted. No rebound or guarding. No masses appreciated  Msk:  Symmetrical without gross deformities. Normal posture. Extremities:  Without clubbing or edema. Neurologic:  Alert and  oriented x4;  grossly normal neurologically. Skin:  Warm and dry, psoriatic lesions to abdomen Psych:  Alert and cooperative. Normal mood and affect.  Intake/Output from previous day: 09/17 0701 - 09/18 0700 In: 4401.7 [P.O.:720; I.V.:3156.7; Blood:525] Out: 1575 [Urine:1575] Intake/Output this shift:    Lab Results:  Basename 08/13/12 0545 08/12/12 0521 08/11/12 1500  WBC 7.3 8.7 8.5  HGB 10.0* 8.8* 7.0*  HCT 29.0* 26.2* 21.4*  PLT 143* 154 188   BMET  Basename 08/13/12 0545 08/12/12 0521 08/11/12 0220  NA 140 140 140  K 3.9 3.9 3.2*  CL 106 107 103  CO2 26 27 24   GLUCOSE 105* 107* 133*  BUN 12 15 39*  CREATININE 1.16 1.08 1.15  CALCIUM 8.3* 8.7 9.0   LFT  Basename 08/13/12 0545 08/12/12 0521 08/11/12 0220  PROT 6.1 6.3 7.8  ALBUMIN 2.6* 2.7* 3.3*  AST 21 23 18   ALT 17 18 12   ALKPHOS 85 79  92  BILITOT 0.5 0.8 0.3  BILIDIR -- -- --  IBILI -- -- --    Assessment: 46 year old male admitted with anemia and rectal bleeding, EGD/TCS performed 9/17 with findings of patent esophageal stricture, multiple ulcers s/p biopsy, small internal hemorrhoids, moderate diverticulosis. Likely anemia and rectal bleeding secondary to PUD, less likely diverticular in origin. Anemia improved. Tolerating diet, no further evidence of overt GI bleeding. Anticipate d/c today.   Plan: PPI BID EGD in 3 mos Follow-up on gastric biopsy Appropriate for d/c back to facility with f/u in our office as outpatient to set up EGD.   LOS: 2 days   Gerrit Halls  08/13/2012, 7:45 AM

## 2012-08-13 NOTE — Care Management Note (Signed)
    Page 1 of 1   08/13/2012     1:38:09 PM   CARE MANAGEMENT NOTE 08/13/2012  Patient:  BLAYZ, OCLAIR   Account Number:  [id number]  Date Initiated:  08/13/2012  Documentation initiated by:  Rosemary Holms  Subjective/Objective Assessment:   [facility] with blood in stool and anemia.     Action/Plan:   [facility]ing to [facility] today   Anticipated DC Date:  08/13/2012   Anticipated DC Plan[facility]  [facility]      DC Planning Services  CM consult      Choice offered to / List presented to:             Status of service:  Completed, signed off Medicare Important Message given?   (If response is "NO", the following Medicare IM given date fields will be blank) Date Medicare IM given:   Date Additional Medicare IM given:    Discharge Disposition:  [facility]  Per UR Regulation:    If discussed at Long Length of Stay Meetings, dates discussed:    Comments:  08/13/12 Rosemary Holms RN BSN CM

## 2012-08-13 NOTE — Telephone Encounter (Signed)
Needs appt in 2 months to set up EGD for reassessment (was inpatient due to gastric ulcers)

## 2012-08-15 ENCOUNTER — Encounter (HOSPITAL_COMMUNITY): Payer: Self-pay | Admitting: Gastroenterology

## 2012-08-18 ENCOUNTER — Telehealth: Payer: Self-pay | Admitting: Gastroenterology

## 2012-08-18 NOTE — Telephone Encounter (Signed)
PLEASE CALL PT'S FACILITY.   His stomach Bx shows ULCERS FROM NSAID USE. CONTtinue PROTONIX OR ALTERNATIVE PPI 30 minutes prior to meals BID. AVOID NSIADS INDEFINITELY. REPEAT EGD TO CONFIRM ULCERS ARE HEALED IN 3 MOS WITH DR. Jena Gauss.

## 2012-08-19 NOTE — Telephone Encounter (Signed)
Called and spoke to the nurse, Ms. Roseanne Reno. Informed of the instructions and faxing the note to her at [phone].

## 2012-08-19 NOTE — Telephone Encounter (Signed)
Recall made for RMR

## 2012-08-22 ENCOUNTER — Encounter: Payer: Self-pay | Admitting: Gastroenterology

## 2012-08-22 NOTE — Telephone Encounter (Signed)
Pt is aware of OV on 12/2 @ 0800 with [facility] and appt card was mailed

## 2012-10-22 ENCOUNTER — Encounter: Payer: Self-pay | Admitting: Gastroenterology

## 2012-10-27 ENCOUNTER — Ambulatory Visit: Admitting: Gastroenterology

## 2013-11-17 IMAGING — CT CT ABD-PELV W/ CM
2 of 4 series · 16 of 46 positions shown, 18 images · IV contrast (Omnipaque 300)
Comparison: None.

CLINICAL DATA: Left side abdominal pain.  Blood in stool.

CT ABDOMEN AND PELVIS WITH CONTRAST
TECHNIQUE: Multidetector CT imaging of the abdomen and pelvis was
performed following the standard protocol during bolus
administration of intravenous contrast.
Contrast: 100mL OMNIPAQUE IOHEXOL 300 MG/ML  SOLN

[Series 3: abd_pel_with 3.0 spo cor · coronal · 0.67mm/px · 3 of 93 slices shown]
[im 31/93  soft-tissue]
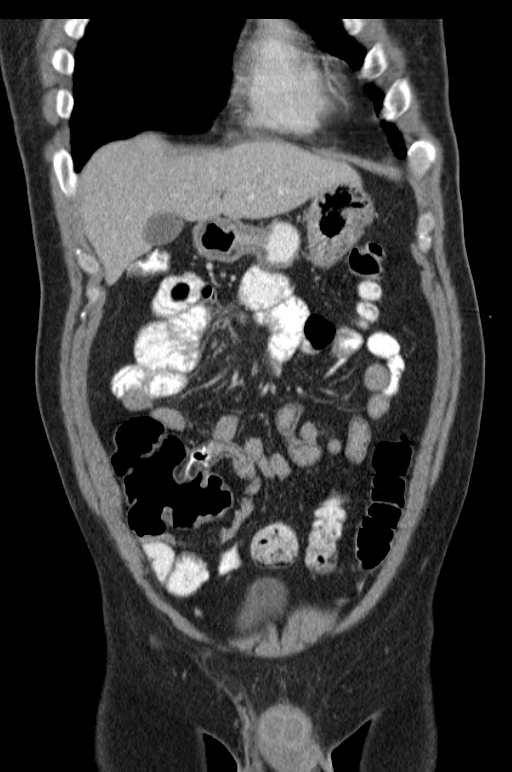
[im 41/93  soft-tissue]
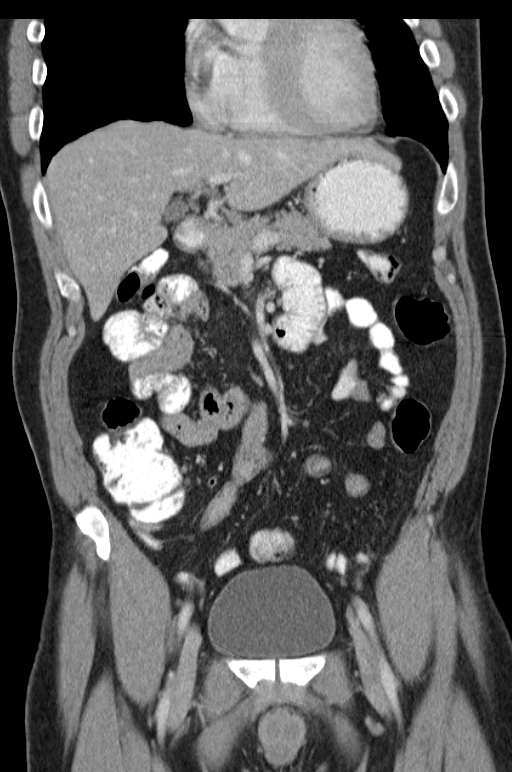
[im 52/93  soft-tissue]
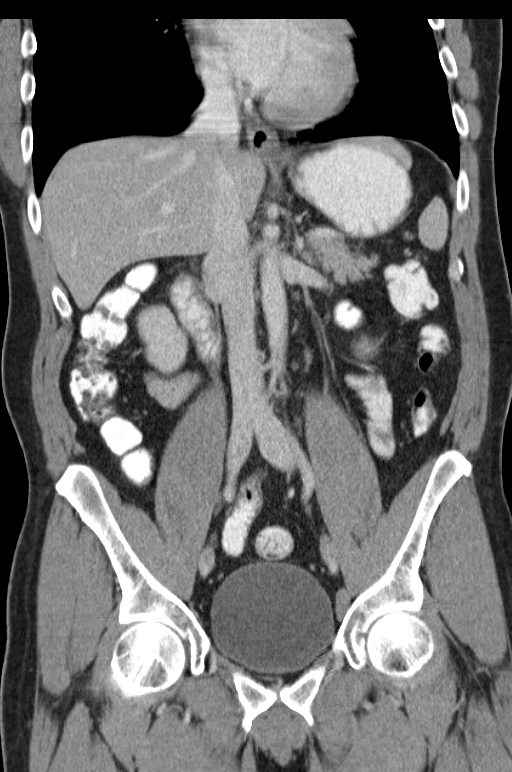

[Series 7: abd_pel_with 5.0 b40f · axial · 0.68mm/px · z∈[-562,-112]mm · 13 of 102 slices shown, 15 images]
[im 6/102  soft-tissue]
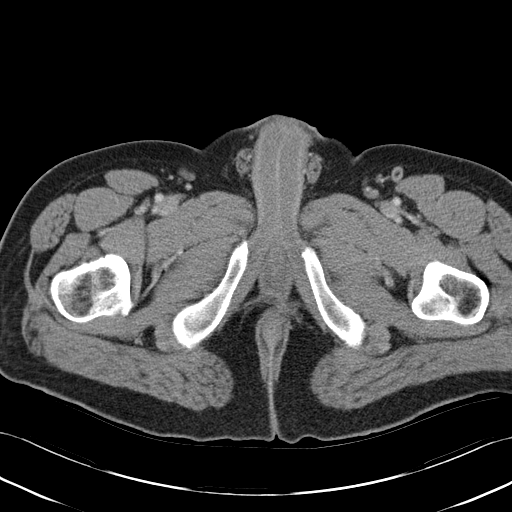
[im 6/102  bone]
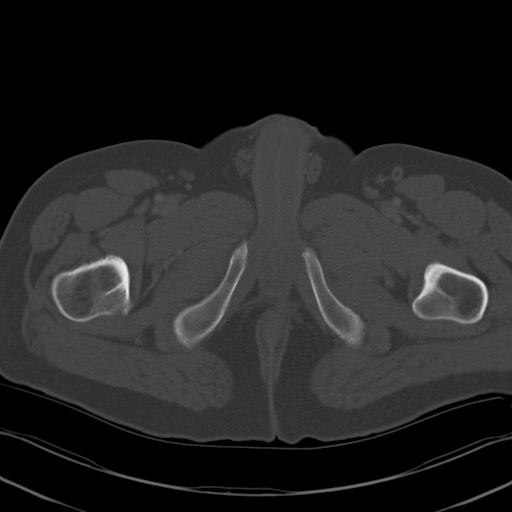
[im 16/102  soft-tissue]
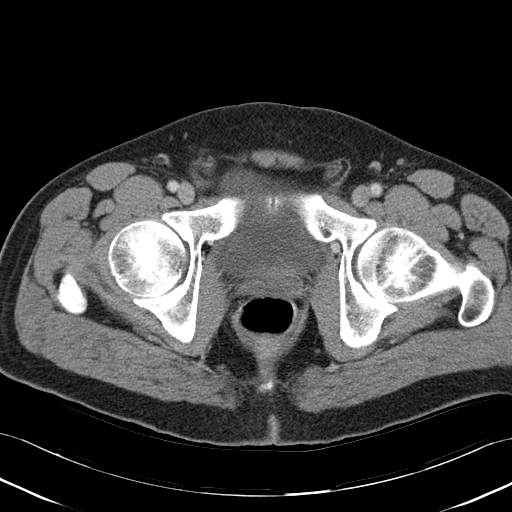
[im 22/102  soft-tissue]
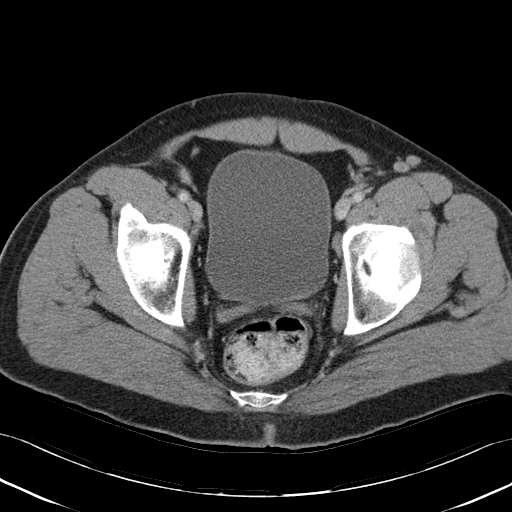
[im 27/102  soft-tissue]
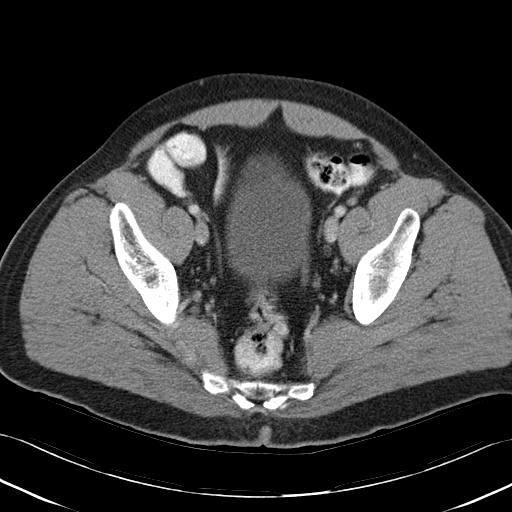
[im 38/102  soft-tissue]
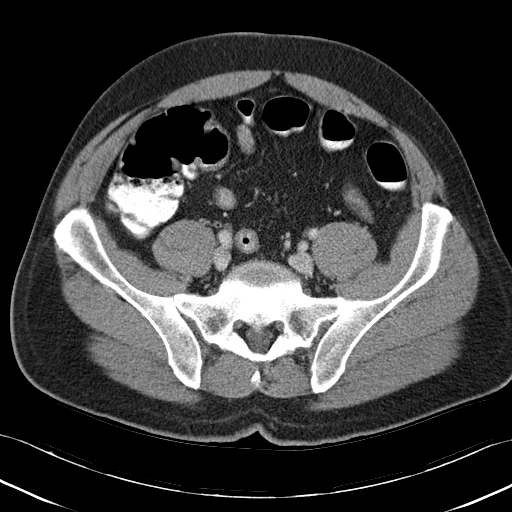
[im 43/102  soft-tissue]
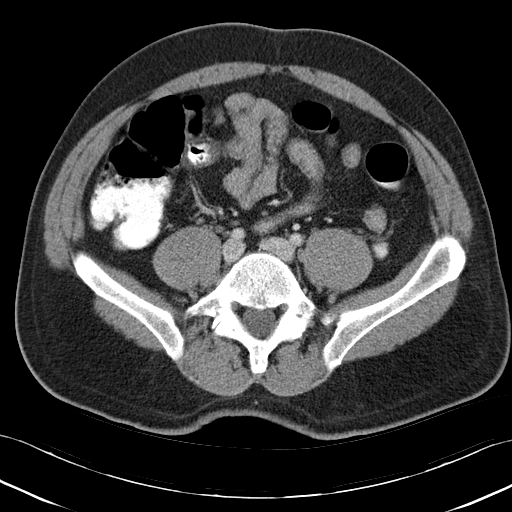
[im 54/102  soft-tissue]
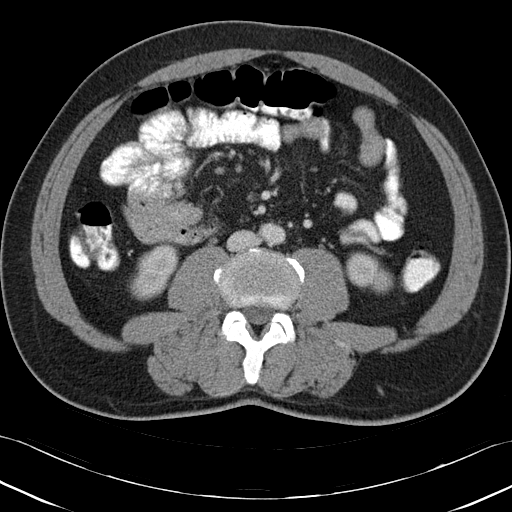
[im 59/102  soft-tissue]
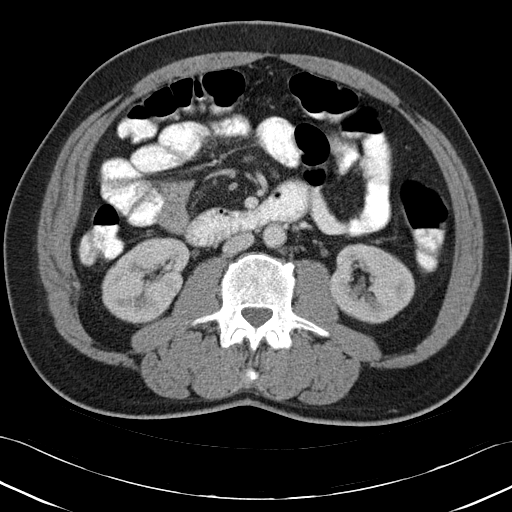
[im 64/102  soft-tissue]
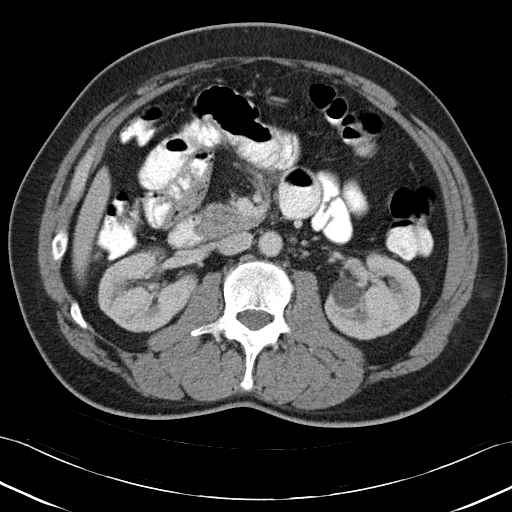
[im 64/102  bone]
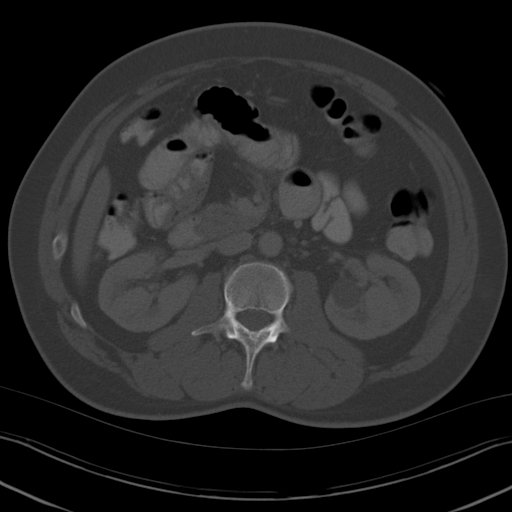
[im 75/102  soft-tissue]
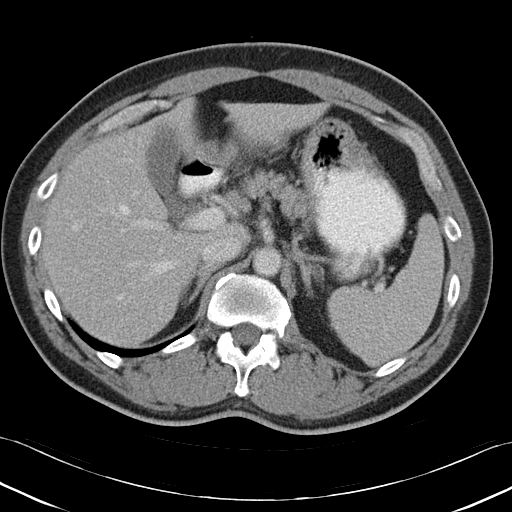
[im 80/102  soft-tissue]
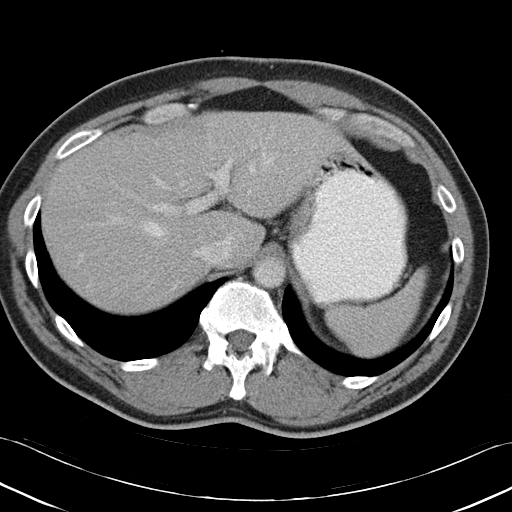
[im 86/102  soft-tissue]
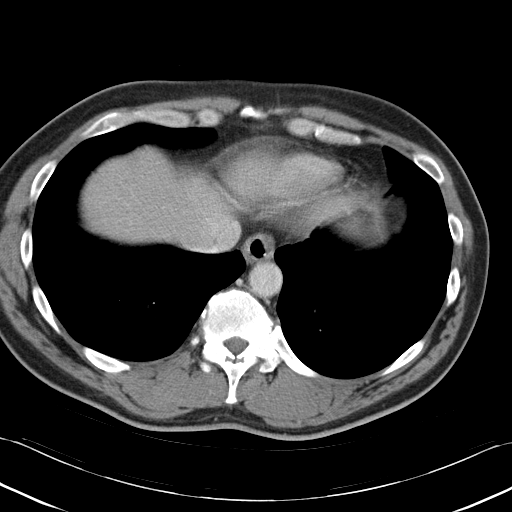
[im 96/102  soft-tissue]
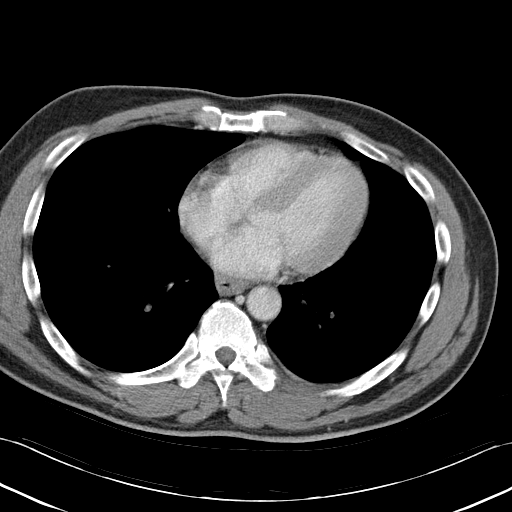

[16 of 46 positions shown; findings below may reference images not displayed]

FINDINGS: Mild dependent atelectasis is seen in the lung bases.
There is no pleural or pericardial effusion.

A 0.6 cm low attenuating lesion in the dome of the liver is likely
a cyst but cannot be definitively characterized.  The liver is
otherwise unremarkable.  The gallbladder, adrenal glands, spleen,
pancreas and right kidney appear normal.  Left renal cyst is noted.
The left kidney otherwise appears normal.

The stomach and small and large bowel are unremarkable.  There is
no lymphadenopathy or fluid.  Prostate gland, seminal vesicles and
urinary bladder appear normal. The sacroiliac joints are fused.
Central disc protrusion at L4-5 is identified..
IMPRESSION: 1.  No acute finding or finding to explain the patient's symptoms.
2.  The sacroiliac joints are fused which could be due to
ankylosing spondylitis.

## 2015-06-26 ENCOUNTER — Inpatient Hospital Stay (HOSPITAL_COMMUNITY)

## 2015-06-26 ENCOUNTER — Inpatient Hospital Stay (HOSPITAL_COMMUNITY)
Admission: EM | Admit: 2015-06-26 | Discharge: 2015-06-28 | DRG: 065 | Disposition: A | Discharge status: Home / Self Care | Attending: Internal Medicine | Admitting: Internal Medicine

## 2015-06-26 ENCOUNTER — Emergency Department (HOSPITAL_COMMUNITY)

## 2015-06-26 ENCOUNTER — Encounter (HOSPITAL_COMMUNITY): Payer: Self-pay | Admitting: Emergency Medicine

## 2015-06-26 DIAGNOSIS — N179 Acute kidney failure, unspecified: Secondary | ICD-10-CM | POA: Diagnosis present

## 2015-06-26 DIAGNOSIS — E876 Hypokalemia: Secondary | ICD-10-CM | POA: Diagnosis present

## 2015-06-26 DIAGNOSIS — I10 Essential (primary) hypertension: Secondary | ICD-10-CM | POA: Diagnosis not present

## 2015-06-26 DIAGNOSIS — G459 Transient cerebral ischemic attack, unspecified: Secondary | ICD-10-CM | POA: Diagnosis not present

## 2015-06-26 DIAGNOSIS — I639 Cerebral infarction, unspecified: Principal | ICD-10-CM | POA: Diagnosis present

## 2015-06-26 DIAGNOSIS — D696 Thrombocytopenia, unspecified: Secondary | ICD-10-CM | POA: Diagnosis present

## 2015-06-26 DIAGNOSIS — K219 Gastro-esophageal reflux disease without esophagitis: Secondary | ICD-10-CM | POA: Diagnosis present

## 2015-06-26 DIAGNOSIS — G8194 Hemiplegia, unspecified affecting left nondominant side: Secondary | ICD-10-CM | POA: Diagnosis present

## 2015-06-26 DIAGNOSIS — N289 Disorder of kidney and ureter, unspecified: Secondary | ICD-10-CM

## 2015-06-26 DIAGNOSIS — R200 Anesthesia of skin: Secondary | ICD-10-CM | POA: Diagnosis present

## 2015-06-26 DIAGNOSIS — I638 Other cerebral infarction: Secondary | ICD-10-CM | POA: Diagnosis not present

## 2015-06-26 HISTORY — DX: Unspecified osteoarthritis, unspecified site: M19.90

## 2015-06-26 HISTORY — DX: Gastric ulcer, unspecified as acute or chronic, without hemorrhage or perforation: K25.9

## 2015-06-26 HISTORY — DX: Cerebral infarction, unspecified: I63.9

## 2015-06-26 HISTORY — DX: Thrombocytopenia, unspecified: D69.6

## 2015-06-26 LAB — CBC WITH DIFFERENTIAL/PLATELET
BASOS PCT: 0 % (ref 0–1)
Basophils Absolute: 0 10*3/uL (ref 0.0–0.1)
EOS PCT: 3 % (ref 0–5)
Eosinophils Absolute: 0.1 10*3/uL (ref 0.0–0.7)
HEMATOCRIT: 43.2 % (ref 39.0–52.0)
HEMOGLOBIN: 14.8 g/dL (ref 13.0–17.0)
LYMPHS PCT: 41 % (ref 12–46)
Lymphs Abs: 2 10*3/uL (ref 0.7–4.0)
MCH: 27.3 pg (ref 26.0–34.0)
MCHC: 34.3 g/dL (ref 30.0–36.0)
MCV: 79.6 fL (ref 78.0–100.0)
MONO ABS: 0.3 10*3/uL (ref 0.1–1.0)
MONOS PCT: 7 % (ref 3–12)
NEUTROS ABS: 2.3 10*3/uL (ref 1.7–7.7)
Neutrophils Relative %: 49 % (ref 43–77)
Platelets: 102 10*3/uL — ABNORMAL LOW (ref 150–400)
RBC: 5.43 MIL/uL (ref 4.22–5.81)
RDW: 14.2 % (ref 11.5–15.5)
Smear Review: ADEQUATE
WBC: 4.7 10*3/uL (ref 4.0–10.5)

## 2015-06-26 LAB — CREATININE, SERUM
Creatinine, Ser: 1.31 mg/dL — ABNORMAL HIGH (ref 0.61–1.24)
GFR calc Af Amer: >60 mL/min (ref >60)

## 2015-06-26 LAB — TROPONIN I
Troponin I: <0.03 ng/mL (ref <0.031)
Troponin I: <0.03 ng/mL (ref <0.031)

## 2015-06-26 LAB — COMPREHENSIVE METABOLIC PANEL
ALBUMIN: 4.1 g/dL (ref 3.5–5.0)
ALK PHOS: 91 U/L (ref 38–126)
ALT: 17 U/L (ref 17–63)
ANION GAP: 10 (ref 5–15)
AST: 27 U/L (ref 15–41)
BUN: 14 mg/dL (ref 6–20)
CALCIUM: 9.1 mg/dL (ref 8.9–10.3)
CHLORIDE: 101 mmol/L (ref 101–111)
CO2: 28 mmol/L (ref 22–32)
CREATININE: 1.46 mg/dL — AB (ref 0.61–1.24)
GFR calc non Af Amer: 56 mL/min — ABNORMAL LOW (ref >60)
GLUCOSE: 130 mg/dL — AB (ref 65–99)
Potassium: 3.1 mmol/L — ABNORMAL LOW (ref 3.5–5.1)
SODIUM: 139 mmol/L (ref 135–145)
TOTAL PROTEIN: 8.3 g/dL — AB (ref 6.5–8.1)
Total Bilirubin: 1 mg/dL (ref 0.3–1.2)

## 2015-06-26 LAB — CBC
HCT: 43.3 % (ref 39.0–52.0)
HEMOGLOBIN: 14.9 g/dL (ref 13.0–17.0)
MCH: 27.3 pg (ref 26.0–34.0)
MCHC: 34.4 g/dL (ref 30.0–36.0)
MCV: 79.3 fL (ref 78.0–100.0)
Platelets: 107 10*3/uL — ABNORMAL LOW (ref 150–400)
RBC: 5.46 MIL/uL (ref 4.22–5.81)
RDW: 14.1 % (ref 11.5–15.5)
WBC: 4.8 10*3/uL (ref 4.0–10.5)

## 2015-06-26 LAB — MRSA PCR SCREENING: MRSA BY PCR: POSITIVE — AB

## 2015-06-26 MED ORDER — AMLODIPINE BESYLATE 5 MG PO TABS: 5.0000 mg | ORAL_TABLET | Freq: Every day | ORAL | Status: DC

## 2015-06-26 MED ORDER — POTASSIUM CHLORIDE CRYS ER 20 MEQ PO TBCR: 40.0000 meq | EXTENDED_RELEASE_TABLET | Freq: Two times a day (BID) | ORAL | Status: DC

## 2015-06-26 MED ORDER — ENOXAPARIN SODIUM 40 MG/0.4ML ~~LOC~~ SOLN
40.0000 mg | SUBCUTANEOUS | Status: DC
  Administered 2015-06-26 – 2015-06-27 (×2): 40 mg via SUBCUTANEOUS
  Filled 2015-06-26 (×2): qty 0.4

## 2015-06-26 MED ORDER — PANTOPRAZOLE SODIUM 40 MG PO TBEC
40.0000 mg | DELAYED_RELEASE_TABLET | Freq: Two times a day (BID) | ORAL | Status: DC
  Administered 2015-06-26 – 2015-06-28 (×5): 40 mg via ORAL
  Filled 2015-06-26 (×5): qty 1

## 2015-06-26 MED ORDER — POTASSIUM CHLORIDE CRYS ER 20 MEQ PO TBCR
40.0000 meq | EXTENDED_RELEASE_TABLET | Freq: Once | ORAL | Status: AC
  Administered 2015-06-26: 40 meq via ORAL
  Filled 2015-06-26: qty 2

## 2015-06-26 MED ORDER — MUPIROCIN 2 % EX OINT
1.0000 "application " | TOPICAL_OINTMENT | Freq: Two times a day (BID) | CUTANEOUS | Status: DC
  Administered 2015-06-26 – 2015-06-28 (×5): 1 via NASAL
  Filled 2015-06-26 (×2): qty 22

## 2015-06-26 MED ORDER — ASPIRIN 325 MG PO TABS
325.0000 mg | ORAL_TABLET | Freq: Every day | ORAL | Status: DC
  Administered 2015-06-26 – 2015-06-28 (×3): 325 mg via ORAL
  Filled 2015-06-26 (×3): qty 1

## 2015-06-26 MED ORDER — ASPIRIN 300 MG RE SUPP
300.0000 mg | Freq: Every day | RECTAL | Status: DC
  Filled 2015-06-26 (×4): qty 1

## 2015-06-26 MED ORDER — LISINOPRIL 10 MG PO TABS: 40.0000 mg | ORAL_TABLET | Freq: Every day | ORAL | Status: DC

## 2015-06-26 MED ORDER — STROKE: EARLY STAGES OF RECOVERY BOOK
Freq: Once | Status: AC
  Administered 2015-06-26: 07:00:00
  Filled 2015-06-26: qty 1

## 2015-06-26 MED ORDER — CHLORHEXIDINE GLUCONATE CLOTH 2 % EX PADS
6.0000 | MEDICATED_PAD | Freq: Every day | CUTANEOUS | Status: DC
  Administered 2015-06-26 – 2015-06-28 (×3): 6 via TOPICAL

## 2015-06-26 MED ORDER — HYDRALAZINE HCL 20 MG/ML IJ SOLN
10.0000 mg | Freq: Four times a day (QID) | INTRAMUSCULAR | Status: DC | PRN
  Administered 2015-06-27: 10 mg via INTRAVENOUS
  Filled 2015-06-26: qty 1

## 2015-06-26 MED ORDER — ASPIRIN 81 MG PO CHEW
324.0000 mg | CHEWABLE_TABLET | Freq: Once | ORAL | Status: AC
  Administered 2015-06-26: 324 mg via ORAL
  Filled 2015-06-26: qty 4

## 2015-06-26 MED ORDER — CARVEDILOL 12.5 MG PO TABS: 12.5000 mg | ORAL_TABLET | Freq: Two times a day (BID) | ORAL | Status: DC

## 2015-06-26 NOTE — ED Notes (Signed)
[facility] from [facility] with c/o numbness to L. Arm and L. Leg that started on Friday afternoon and was accompanied by chest pain. Pt denies chest pain currently but still c/o haviness or numbness to L. Arm and L. Leg.

## 2015-06-26 NOTE — Progress Notes (Signed)
[facility] PROGRESS NOTE  [name] [name] ZOX:[id number] DOB: 15-Aug-1966 DOA: 06/26/2015 PCP: No primary care provider on file.  Assessment/Plan: 1. CVA. Patient presented to the hospital with left side numbness.  CT head in ED showed sub centimeter lacunar infarct.   He will need further work up per stroke order set with MRI head, carotid dopplers and echocardiogram., lipid panel and Hemoglobin A1C have been ordered.  PT and OT evaluations have also been ordered. Continue pt on aspirin.   2. Hypertension. Patient was remarkably hypertensive on admission with a bp of 214/130. Blood pressure since admission appears to have improved. Will hold antihypertensive medications for first 24 hours to allow for permissive hypertension.  Will use as needed hydralazine for severe hypertension. Restart home regimen of antihypertensives tomorrow.  3. Thrombocytopenia. Etiology is unclear but maybe related to severe hypertension.  No signs of bleeding will continue to monitor.   4. Hypokalemia. Replace potassium  5. GERD. Continue Protonix   Code Status: Full DVT Prophylaxis: Lovenox Family Communication:  Discussed case in detail with pt and he understands the plan  Disposition Plan: Home once improved   Consultants:    Procedures:  ECHO  Antibiotics:    HPI/Subjective: Patients hands feel numb, waxing and waning.  Now it is coming back worse.  One episode of CP Friday 7/29 while sitting down that is currently resolved.  Denies previous CVA.  No change in vision, speech or facial weakness. No n/v/d/abdominal pain or SOB.  Objective: Filed Vitals:   06/26/15 0830  BP: 183/107  Pulse: 62  Temp: 97.8 F (36.6 C)  Resp: 18   No intake or output data in the 24 hours ending 06/26/15 0913 Filed Weights   06/26/15 0209 06/26/15 0630  Weight: 95.709 kg (211 lb) 89.6 kg (197 lb 8.5 oz)    Exam:   General:  NAD, VSS  Cardiovascular: RRR, s1, s2   ENT: moist mucous  membranes  Eyes: PERRL, EOMI  Respiratory: CTAB, no wheezing, rales or rhonchi   Abdomen: ntnd, soft, positive bowel sounds  Musculoskeletal: no LE edema bilateral   Neurological:  Grip strength 5/5 RUE and 4/5 LUE, left pronator drift , cranial nerves grossly intact, 5/5 bilateral LE strength   Data Reviewed: Basic Metabolic Panel:  Recent Labs Lab 06/26/15 0250 06/26/15 0649  NA 139  --   K 3.1*  --   CL 101  --   CO2 28  --   GLUCOSE 130*  --   BUN 14  --   CREATININE 1.46* 1.31*  CALCIUM 9.1  --    Liver Function Tests:  Recent Labs Lab 06/26/15 0250  AST 27  ALT 17  ALKPHOS 91  BILITOT 1.0  PROT 8.3*  ALBUMIN 4.1   No results for input(s): LIPASE, AMYLASE in the last 168 hours. No results for input(s): AMMONIA in the last 168 hours. CBC:  Recent Labs Lab 06/26/15 0250 06/26/15 0649  WBC 4.7 4.8  NEUTROABS 2.3  --   HGB 14.8 14.9  HCT 43.2 43.3  MCV 79.6 79.3  PLT 102* 107*   Cardiac Enzymes:  Recent Labs Lab 06/26/15 0250 06/26/15 0649  TROPONINI <0.03 <0.03   BNP (last 3 results) No results for input(s): BNP in the last 8760 hours.  ProBNP (last 3 results) No results for input(s): PROBNP in the last 8760 hours.  CBG: No results for input(s): GLUCAP in the last 168 hours.  Recent Results (from the past 240 hour(s))  MRSA PCR  Screening     Status: Abnormal   Collection Time: 06/26/15  6:03 AM  Result Value Ref Range Status   MRSA by PCR POSITIVE (A) NEGATIVE Final    Comment:        The GeneXpert MRSA Assay (FDA approved for NASAL specimens only), is one component of a comprehensive MRSA colonization surveillance program. It is not intended to diagnose MRSA infection nor to guide or monitor treatment for MRSA infections. CRITICAL RESULT CALLED TO, READ BACK BY AND VERIFIED WITH: [name] [name] AT 1610 ON 06/26/2015 BY WOODS, M      Studies: Ct Head Wo Contrast  06/26/2015   CLINICAL DATA:  Left arm and leg numbness.  Symptoms for 2 days. Stroke.  EXAM: CT HEAD WITHOUT CONTRAST  TECHNIQUE: Contiguous axial images were obtained from the base of the skull through the vertex without intravenous contrast.  COMPARISON:  None.  FINDINGS: Small subcentimeter lacunar infarct right caudate. No territorial infarct. No intracranial hemorrhage, mass effect, or midline shift. No hydrocephalus. The basilar cisterns are patent. No intracranial fluid collection. Calvarium is intact. Included paranasal sinuses and mastoid air cells are well aerated.  IMPRESSION: Subcentimeter lacunar infarct right caudate, may be acute. No intracranial hemorrhage.   Electronically Signed   By: Rubye Oaks M.D.   On: 06/26/2015 04:17    Scheduled Meds: .  stroke: mapping our early stages of recovery book   Does not apply Once  . aspirin  300 mg Rectal Daily   Or  . aspirin  325 mg Oral Daily  . Chlorhexidine Gluconate Cloth  6 each Topical Q0600  . enoxaparin (LOVENOX) injection  40 mg Subcutaneous Q24H  . mupirocin ointment  1 application Nasal BID  . pantoprazole  40 mg Oral BID AC   Continuous Infusions:   Active Problems:   HTN (hypertension)   Stroke   CVA (cerebral infarction)    Time spent: 30 minutes    [name] [name], M.D.  [facility] Pager [phone]. If 7PM-7AM, please contact night-coverage at [email or web address], password [email or web address] 06/26/2015, 9:13 AM  LOS: 0 days      I, [name] [name], acting a scribe, recorded this note contemporaneously in the presence of Dr. Erick [name], M.D. on 06/26/2015 at 9:13 AM   Attending: I have reviewed the above documentation for accuracy and completeness, and I agree with the above.  [name] [name]

## 2015-06-26 NOTE — ED Provider Notes (Signed)
CSN: [id number]     Arrival date & time 06/26/15  0151 History   First MD Initiated Contact with Patient 06/26/15 0240     Chief Complaint  Patient presents with  . Chest Pain  . Numbness     (Consider location/radiation/quality/duration/timing/severity/associated sxs/prior Treatment) Patient is a 49 y.o. male presenting with chest pain. The history is provided by the patient.  Chest Pain He had an episode of chest pain on Friday, July 29. This episode came on at rest and was described as a sharp pain in the left chest which lasted 5 minutes before resolving. There was no associated dyspnea, nausea, diaphoresis. There was associated numbness in his left arm and left leg. The left arm and leg continued to be numb but he has not had anymore episodes of chest pain. He has not noticed any weakness. He denies headache. He is treated for hypertension but no history of diabetes or hyperlipidemia.  Past Medical History  Diagnosis Date  . Hypertension   . Gout   . Psoriasis    Past Surgical History  Procedure Laterality Date  . No past surgeries    . Colonoscopy  08/12/2012    SLF: Normal mucosa in the terminal ileum Moderate diverticulosis was noted in the ascending colon and proximal transverse colon/  The colon mucosa was otherwise normal/ Small internal hemorrhoids  . Esophagogastroduodenoscopy  08/12/2012    SLF: PATENT ESOPHAGEAL STRICTURE/ The esophagus was otherwise normal/ Four ulcers ranging between 3-5 mm in size were found in the gastric antrum/ Two ulcers ranging between 3-72mm in size were found in the duodenal bulb/ ANEMIA/?BRBPR DUE TO PUD, DOUBT RIGHT SIDED DIVERTICULAR BLEED   Family History  Problem Relation Age of Onset  . Colon polyps Neg Hx   . Colon cancer Neg Hx    History  Substance Use Topics  . Smoking status: Never Smoker   . Smokeless tobacco: Not on file  . Alcohol Use: No    Review of Systems  Cardiovascular: Positive for chest pain.  All other systems  reviewed and are negative.     Allergies  Review of patient's allergies indicates no known allergies.  Home Medications   Prior to Admission medications   Medication Sig Start Date End Date Taking? Authorizing Provider  amLODipine (NORVASC) 5 MG tablet Take 5 mg by mouth daily.   Yes Historical Provider, MD  carvedilol (COREG) 12.5 MG tablet Take 12.5 mg by mouth 2 (two) times daily with a meal.   Yes Historical Provider, MD  lisinopril (PRINIVIL,ZESTRIL) 40 MG tablet Take 40 mg by mouth daily.   Yes Historical Provider, MD  methotrexate (RHEUMATREX) 2.5 MG tablet Take 20 mg by mouth once a week. Patient takes 8 tabs (20 mg) on Monday. Caution:Chemotherapy. Protect from light.   Yes Historical Provider, MD  Skin Protectants, Misc. (EUCERIN) cream Apply topically as needed.   Yes Historical Provider, MD  triamterene-hydrochlorothiazide (MAXZIDE) 75-50 MG per tablet Take 1 tablet by mouth daily.   Yes Historical Provider, MD  diphenhydrAMINE (SOMINEX) 25 MG tablet Take 25 mg by mouth at bedtime as needed.    Historical Provider, MD  pantoprazole (PROTONIX) 40 MG tablet Take 1 tablet (40 mg total) by mouth 2 (two) times daily before a meal. 08/13/12   [name], MD   BP 178/114 mmHg  Pulse 71  Temp(Src) 98.2 F (36.8 C) (Oral)  Resp 20  Ht 5\' 11"  (1.803 m)  Wt 211 lb (95.709 kg)  BMI 29.44 kg/m2  SpO2 94% Physical Exam  Nursing note and vitals reviewed.  49 year old male, resting comfortably and in no acute distress. Vital signs are significant for hypertension. Oxygen saturation is 94%, which is normal. Head is normocephalic and atraumatic. PERRLA, EOMI. Oropharynx is clear. Neck is nontender and supple without adenopathy or JVD. Questionable right carotid bruit. Back is nontender and there is no CVA tenderness. Lungs are clear without rales, wheezes, or rhonchi. Chest is nontender. Heart has regular rate and rhythm without murmur. Abdomen is soft, flat, nontender without  masses or hepatosplenomegaly and peristalsis is normoactive. Extremities have no cyanosis or edema, full range of motion is present. Skin is warm and dry without rash. Neurologic: Mental status is normal, cranial nerves are intact. There is mild weakness of the left arm and leg with strength 4/5. No objective sensory deficit resin.  ED Course  Procedures (including critical care time) Labs Review Results for orders placed or performed during the hospital encounter of 06/26/15  Comprehensive metabolic panel  Result Value Ref Range   Sodium 139 135 - 145 mmol/L   Potassium 3.1 (L) 3.5 - 5.1 mmol/L   Chloride 101 101 - 111 mmol/L   CO2 28 22 - 32 mmol/L   Glucose, Bld 130 (H) 65 - 99 mg/dL   BUN 14 6 - 20 mg/dL   Creatinine, Ser 1.61 (H) 0.61 - 1.24 mg/dL   Calcium 9.1 8.9 - 09.6 mg/dL   Total Protein 8.3 (H) 6.5 - 8.1 g/dL   Albumin 4.1 3.5 - 5.0 g/dL   AST 27 15 - 41 U/L   ALT 17 17 - 63 U/L   Alkaline Phosphatase 91 38 - 126 U/L   Total Bilirubin 1.0 0.3 - 1.2 mg/dL   GFR calc non Af Amer 56 (L) >60 mL/min   GFR calc Af Amer >60 >60 mL/min   Anion gap 10 5 - 15  Troponin I  Result Value Ref Range   Troponin I <0.03 <0.031 ng/mL  CBC with Differential  Result Value Ref Range   WBC 4.7 4.0 - 10.5 K/uL   RBC 5.43 4.22 - 5.81 MIL/uL   Hemoglobin 14.8 13.0 - 17.0 g/dL   HCT 04.5 40.9 - 81.1 %   MCV 79.6 78.0 - 100.0 fL   MCH 27.3 26.0 - 34.0 pg   MCHC 34.3 30.0 - 36.0 g/dL   RDW 91.4 78.2 - 95.6 %   Platelets 102 (L) 150 - 400 K/uL   Neutrophils Relative % 49 43 - 77 %   Neutro Abs 2.3 1.7 - 7.7 K/uL   Lymphocytes Relative 41 12 - 46 %   Lymphs Abs 2.0 0.7 - 4.0 K/uL   Monocytes Relative 7 3 - 12 %   Monocytes Absolute 0.3 0.1 - 1.0 K/uL   Eosinophils Relative 3 0 - 5 %   Eosinophils Absolute 0.1 0.0 - 0.7 K/uL   Basophils Relative 0 0 - 1 %   Basophils Absolute 0.0 0.0 - 0.1 K/uL   RBC Morphology STOMATOCYTES    Smear Review PLATELETS APPEAR ADEQUATE    Imaging  Review Ct Head Wo Contrast  06/26/2015   CLINICAL DATA:  Left arm and leg numbness. Symptoms for 2 days. Stroke.  EXAM: CT HEAD WITHOUT CONTRAST  TECHNIQUE: Contiguous axial images were obtained from the base of the skull through the vertex without intravenous contrast.  COMPARISON:  None.  FINDINGS: Small subcentimeter lacunar infarct right caudate. No territorial infarct. No intracranial hemorrhage, mass effect,  or midline shift. No hydrocephalus. The basilar cisterns are patent. No intracranial fluid collection. Calvarium is intact. Included paranasal sinuses and mastoid air cells are well aerated.  IMPRESSION: Subcentimeter lacunar infarct right caudate, may be acute. No intracranial hemorrhage.   Electronically Signed   By: Rubye Oaks M.D.   On: 06/26/2015 04:17   Images viewed by me.   EKG Interpretation   Date/Time:  Sunday June 26 2015 02:05:03 EDT Ventricular Rate:  71 PR Interval:  201 QRS Duration: 114 QT Interval:  393 QTC Calculation: 427 R Axis:   27 Text Interpretation:  Sinus rhythm Consider left atrial enlargement  Borderline intraventricular conduction delay Borderline repolarization  abnormality Borderline ST elevation, anterior leads No old tracing to  compare Confirmed by [facility]  MD, Brittan Mapel ([id number]) on 06/26/2015 2:18:16 AM      MDM   Final diagnoses:  Stroke  Renal insufficiency  Thrombocytopenia  Hypokalemia  Essential hypertension    Probable stroke with left-sided weakness. He is well outside the window for code stroke with last known normal being approximately 30 hours ago. He is given aspirin and sent for CT scan and will need to be admitted.  CT is consistent with recent stroke. Incidental finding of mild renal insufficiency and thrombocytopenia as well as hypokalemia. He is given oral potassium. On review of old records, creatinine has increased from 1. 161.46, but this is over 3 years. ASIS discussed with Dr. Sharl Ma of [facility] agrees to admit the  patient.  Dione Booze, MD 06/26/15 [phone]

## 2015-06-26 NOTE — H&P (Signed)
PCP:   No primary care provider on file.   Chief Complaint:  Chest pain, left side numbness  HPI: 49 year old male who   has a past medical history of Hypertension; Gout; and Psoriasis. Today was brought to the hospital from [facility] after patient complained of chest pain and left-sided numbness. Patient says that he developed numbness of left arm and leg on Friday and they have continued to be numb. At that time he also had chest pain which lasted for about 5 minutes. He denies chest pain at this time. Patient also noticed some weakness of the left side, but denies any blurred vision. Denies headache. No previous history of stroke. He has a history of hypertension. In the ED CT head was done which showed subcentimeter lacunar infarct right caudate, may be acute and no intracranial hemorrhage. Since patient was last normal on Friday, out of window for TPA.   Allergies:  No Known Allergies    Past Medical History  Diagnosis Date  . Hypertension   . Gout   . Psoriasis     Past Surgical History  Procedure Laterality Date  . No past surgeries    . Colonoscopy  08/12/2012    SLF: Normal mucosa in the terminal ileum Moderate diverticulosis was noted in the ascending colon and proximal transverse colon/  The colon mucosa was otherwise normal/ Small internal hemorrhoids  . Esophagogastroduodenoscopy  08/12/2012    SLF: PATENT ESOPHAGEAL STRICTURE/ The esophagus was otherwise normal/ Four ulcers ranging between 3-5 mm in size were found in the gastric antrum/ Two ulcers ranging between 3-33mm in size were found in the duodenal bulb/ ANEMIA/?BRBPR DUE TO PUD, DOUBT RIGHT SIDED DIVERTICULAR BLEED    Prior to Admission medications   Medication Sig Start Date End Date Taking? Authorizing Provider  amLODipine (NORVASC) 5 MG tablet Take 5 mg by mouth daily.   Yes Historical Provider, MD  carvedilol (COREG) 12.5 MG tablet Take 12.5 mg by mouth 2 (two) times daily with a meal.   Yes  Historical Provider, MD  lisinopril (PRINIVIL,ZESTRIL) 40 MG tablet Take 40 mg by mouth daily.   Yes Historical Provider, MD  methotrexate (RHEUMATREX) 2.5 MG tablet Take 20 mg by mouth once a week. Patient takes 8 tabs (20 mg) on Monday. Caution:Chemotherapy. Protect from light.   Yes Historical Provider, MD  Skin Protectants, Misc. (EUCERIN) cream Apply topically as needed.   Yes Historical Provider, MD  triamterene-hydrochlorothiazide (MAXZIDE) 75-50 MG per tablet Take 1 tablet by mouth daily.   Yes Historical Provider, MD  diphenhydrAMINE (SOMINEX) 25 MG tablet Take 25 mg by mouth at bedtime as needed.    Historical Provider, MD  pantoprazole (PROTONIX) 40 MG tablet Take 1 tablet (40 mg total) by mouth 2 (two) times daily before a meal. 08/13/12   [name], MD    Social History:  reports that he has never smoked. He does not have any smokeless tobacco history on file. He reports that he does not drink alcohol or use illicit drugs.  Family History  Problem Relation Age of Onset  . Colon polyps Neg Hx   . Colon cancer Neg Hx     Filed Weights   06/26/15 0209  Weight: 95.709 kg (211 lb)    All the positives are listed in BOLD  Review of Systems:  HEENT: Headache, blurred vision, runny nose, sore throat Neck: Hypothyroidism, hyperthyroidism,,lymphadenopathy Chest : Shortness of breath, history of COPD, Asthma Heart : Chest pain, history of coronary arterey  disease GI:  Nausea, vomiting, diarrhea, constipation, GERD GU: Dysuria, urgency, frequency of urination, hematuria Neuro: Stroke, seizures, syncope Psych: Depression, anxiety, hallucinations   Physical Exam: Blood pressure 158/114, pulse 63, temperature 98.2 F (36.8 C), temperature source Oral, resp. rate 19, height 5\' 11"  (1.803 m), weight 95.709 kg (211 lb), SpO2 97 %. Constitutional:   Patient is a well-developed and well-nourished *male  in no acute distress and cooperative with exam. Head: Normocephalic and  atraumatic Mouth: Mucus membranes moist Eyes: PERRL, EOMI, conjunctivae normal Neck: Supple, No Thyromegaly Cardiovascular: RRR, S1 normal, S2 normal Pulmonary/Chest: CTAB, no wheezes, rales, or rhonchi Abdominal: Soft. Non-tender, non-distended, bowel sounds are normal, no masses, organomegaly, or guarding present.  Neurological: A&O x3, Strength is4/5 in both left upper and left lower extremity , cranial nerve II-XII are grossly intact, no focal motor deficit, sensory intact to light touch bilaterally.  Extremities : No Cyanosis, Clubbing or Edema  Labs on Admission:  Basic Metabolic Panel:  Recent Labs Lab 06/26/15 0250  NA 139  K 3.1*  CL 101  CO2 28  GLUCOSE 130*  BUN 14  CREATININE 1.46*  CALCIUM 9.1   Liver Function Tests:  Recent Labs Lab 06/26/15 0250  AST 27  ALT 17  ALKPHOS 91  BILITOT 1.0  PROT 8.3*  ALBUMIN 4.1   No results for input(s): LIPASE, AMYLASE in the last 168 hours. No results for input(s): AMMONIA in the last 168 hours. CBC:  Recent Labs Lab 06/26/15 0250  WBC 4.7  NEUTROABS 2.3  HGB 14.8  HCT 43.2  MCV 79.6  PLT 102*   Cardiac Enzymes:  Recent Labs Lab 06/26/15 0250  TROPONINI <0.03     Radiological Exams on Admission: Ct Head Wo Contrast  06/26/2015   CLINICAL DATA:  Left arm and leg numbness. Symptoms for 2 days. Stroke.  EXAM: CT HEAD WITHOUT CONTRAST  TECHNIQUE: Contiguous axial images were obtained from the base of the skull through the vertex without intravenous contrast.  COMPARISON:  None.  FINDINGS: Small subcentimeter lacunar infarct right caudate. No territorial infarct. No intracranial hemorrhage, mass effect, or midline shift. No hydrocephalus. The basilar cisterns are patent. No intracranial fluid collection. Calvarium is intact. Included paranasal sinuses and mastoid air cells are well aerated.  IMPRESSION: Subcentimeter lacunar infarct right caudate, may be acute. No intracranial hemorrhage.   Electronically Signed    By: Rubye Oaks M.D.   On: 06/26/2015 04:17    EKG: Independently reviewed. Sinus rhythm, ST-T changes in the anterior leads   Assessment/Plan Active Problems:   HTN (hypertension)   Stroke   CVA (cerebral infarction)  CVA  Admit the patient, stroke workup including MRA/MRI brain, carotid Doppler, echocardiogram, fasting lipid profile, hemoglobin A1c  Continue aspirin  Hypertension  Hold the-anti-hypertensive medications for permissive hypertension.  Chest pain Resolved at this time, will cycle the cardiac enzymes.  Code status:Full code  Family discussion: No family at bedside   Time Spent on Admission: 55 min*  [facility] Pager: [phone] 06/26/2015, 6:38 AM  If 7PM-7AM, please contact night-coverage  [email or web address]  Password [email or web address]

## 2015-06-27 ENCOUNTER — Inpatient Hospital Stay (HOSPITAL_COMMUNITY)

## 2015-06-27 ENCOUNTER — Encounter (HOSPITAL_COMMUNITY): Payer: Self-pay | Admitting: Internal Medicine

## 2015-06-27 DIAGNOSIS — N179 Acute kidney failure, unspecified: Secondary | ICD-10-CM | POA: Diagnosis present

## 2015-06-27 DIAGNOSIS — D696 Thrombocytopenia, unspecified: Secondary | ICD-10-CM | POA: Diagnosis present

## 2015-06-27 LAB — CBC
HCT: 44.2 % (ref 39.0–52.0)
HEMOGLOBIN: 14.9 g/dL (ref 13.0–17.0)
MCH: 27.1 pg (ref 26.0–34.0)
MCHC: 33.7 g/dL (ref 30.0–36.0)
MCV: 80.4 fL (ref 78.0–100.0)
PLATELETS: 82 10*3/uL — AB (ref 150–400)
RBC: 5.5 MIL/uL (ref 4.22–5.81)
RDW: 14.2 % (ref 11.5–15.5)
WBC: 4.5 10*3/uL (ref 4.0–10.5)

## 2015-06-27 LAB — LIPID PANEL
CHOL/HDL RATIO: 4.8 ratio
Cholesterol: 164 mg/dL (ref 0–200)
HDL: 34 mg/dL — ABNORMAL LOW (ref >40)
LDL Cholesterol: 114 mg/dL — ABNORMAL HIGH (ref 0–99)
Triglycerides: 78 mg/dL (ref <150)
VLDL: 16 mg/dL (ref 0–40)

## 2015-06-27 LAB — BASIC METABOLIC PANEL
ANION GAP: 6 (ref 5–15)
BUN: 15 mg/dL (ref 6–20)
CALCIUM: 8.7 mg/dL — AB (ref 8.9–10.3)
CO2: 25 mmol/L (ref 22–32)
CREATININE: 1.29 mg/dL — AB (ref 0.61–1.24)
Chloride: 107 mmol/L (ref 101–111)
GFR calc non Af Amer: >60 mL/min (ref >60)
GLUCOSE: 113 mg/dL — AB (ref 65–99)
Potassium: 3.4 mmol/L — ABNORMAL LOW (ref 3.5–5.1)
Sodium: 138 mmol/L (ref 135–145)

## 2015-06-27 LAB — VITAMIN B12: Vitamin B-12: 411 pg/mL (ref 180–914)

## 2015-06-27 LAB — LACTATE DEHYDROGENASE: LDH: 193 U/L — AB (ref 98–192)

## 2015-06-27 MED ORDER — AMLODIPINE BESYLATE 5 MG PO TABS
5.0000 mg | ORAL_TABLET | Freq: Every day | ORAL | Status: DC
  Administered 2015-06-27 – 2015-06-28 (×2): 5 mg via ORAL
  Filled 2015-06-27 (×2): qty 1

## 2015-06-27 MED ORDER — ASPIRIN EC 81 MG PO TBEC: 81.0000 mg | DELAYED_RELEASE_TABLET | Freq: Every day | ORAL | Status: AC

## 2015-06-27 MED ORDER — ATORVASTATIN CALCIUM 20 MG PO TABS
20.0000 mg | ORAL_TABLET | Freq: Every day | ORAL | Status: DC
  Administered 2015-06-27: 20 mg via ORAL
  Filled 2015-06-27: qty 1

## 2015-06-27 MED ORDER — CARVEDILOL 12.5 MG PO TABS
12.5000 mg | ORAL_TABLET | Freq: Two times a day (BID) | ORAL | Status: DC
  Administered 2015-06-27 – 2015-06-28 (×2): 12.5 mg via ORAL
  Filled 2015-06-27 (×2): qty 1

## 2015-06-27 MED ORDER — ACETAMINOPHEN 325 MG PO TABS
650.0000 mg | ORAL_TABLET | ORAL | Status: DC | PRN
  Administered 2015-06-27: 650 mg via ORAL
  Filled 2015-06-27: qty 2

## 2015-06-27 MED ORDER — TRIAMTERENE 50 MG PO CAPS: 50.0000 mg | ORAL_CAPSULE | Freq: Every day | ORAL | Status: DC

## 2015-06-27 MED ORDER — LISINOPRIL 10 MG PO TABS
40.0000 mg | ORAL_TABLET | Freq: Every day | ORAL | Status: DC
  Administered 2015-06-27 – 2015-06-28 (×2): 40 mg via ORAL
  Filled 2015-06-27 (×2): qty 4

## 2015-06-27 MED ORDER — POTASSIUM CHLORIDE CRYS ER 20 MEQ PO TBCR
40.0000 meq | EXTENDED_RELEASE_TABLET | Freq: Once | ORAL | Status: AC
  Administered 2015-06-27: 40 meq via ORAL
  Filled 2015-06-27: qty 2

## 2015-06-27 NOTE — Progress Notes (Signed)
Physician Discharge Summary  [name] [name] ZOX:[id number] DOB: Oct 31, 1966 DOA: 06/26/2015  PCP: No primary care provider on file.  Admit date: 06/26/2015 Discharge date: 06/27/2015  Time spent: 40 minutes  Recommendations for Outpatient Follow-up:  1. Follow up with provider at facility 1 week. Recommend BMET to track kidney function, potassium level and CBC to track platelets   Discharge Diagnoses:  Principal Problem:   CVA (cerebral infarction) Active Problems:   HTN (hypertension)   Stroke   Essential hypertension   Hypokalemia   Acute renal failure   Thrombocytopenia   Discharge Condition: stable  Diet recommendation: heart healthy  Filed Weights   06/26/15 0209 06/26/15 0630  Weight: 95.709 kg (211 lb) 89.6 kg (197 lb 8.5 oz)    History of present illness:  49 year old male with  past medical history of Hypertension; Gout; and Psoriasis was brought to the hospital on 06/26/15 from correctional [facility] after patient complained of chest pain and left-sided numbness. Patient reported that he developed numbness of left arm and leg 2 days prior and they continued to be numb. At that time he also had chest pain which lasted for about 5 minutes. He denied chest pain on presentation. Patient also noticed some weakness of the left side, but denied any blurred vision. Denied headache. No previous history of stroke. He has a history of hypertension. In the ED CT head was done which showed subcentimeter lacunar infarct right caudate, may be acute and no intracranial hemorrhage. Since patient was last normal on Friday, out of window for TPA.  Hospital Course:  1. CVA. Patient presented to the hospital with left side numbness. CT head in ED showed sub centimeter lacunar infarct. MRI head reveal acute infarct deep white matter on right, chronic microvascular ischemia, MRA negative,  carotid dopplers with very minimal left sided thickening not resulting in hemodyamically significant  stenosis and normal on right and echocardiogram with EF 60-65% and grade 1 diastolic dysfunction, lipid panel HDL 34 and LDL 114 otherwise within limits of norma. Hemoglobin A1C in process. PT and OT evaluations have also been ordered. Continue pt on aspirin.  2. Hypertension. Patient was remarkably hypertensive on admission with a bp of 214/130. Blood pressure improved and home antihypertensive medications held for first 24 hours to allow for permissive hypertension. Poor control today. Restart home regimen of antihypertensives except for HCTZ secondary to #6. Will monitor and continue prn hydralazine.  3. Thrombocytopenia. Etiology is unclear but maybe related to severe hypertension in setting of methotrexate use. Will obtain HIV, HCV, B12 and folate.  No signs of bleeding. Chart review indicates platelet count within limits of normal 2 years ago. Patient in prison for last 22 years. Will need close OP follow up  4. Hypokalemia. Likely related to maxide.  Replaced potassium.. Recommend close OP follow up 5. GERD. Stable at baseline Continue Protonix 6. Acute renal failure: chart review indicates 2 years ago creatinine within normal limits. Creatine 1.46 on admission. Likely related to #2 and decreased oral intake in setting of ACE I and HCTZ. Urine Creatinine trending up at discharge after IV fluids.Urine output good.  Recommend BMET in one week.   Procedures:  There was moderate concentric hypertrophy. Systolic function was normal. The estimated ejection fraction was in the range of 60% to 65%. Wall motion was normal; there were no regional wall motion abnormalities. Doppler parameters are consistent with abnormal left ventricular relaxation (grade 1 diastolic dysfunction  Consultations:  none  Discharge Exam: Filed Vitals:   06/27/15  0539  BP: 179/101  Pulse: 64  Temp: 98.3 F (36.8 C)  Resp: 20    General: well nourished appears comfortable Cardiovascular: RRR no  MGR no LE edema Respiratory: normal effort BS clear bilaterally no wheeze Neuro: alert oriented. Left grip 4/5 right grip 5/5. Bilateral LE strength 5/5.   Discharge Instructions    Current Discharge Medication List    START taking these medications   Details  aspirin EC 81 MG tablet Take 1 tablet (81 mg total) by mouth daily.      CONTINUE these medications which have NOT CHANGED   Details  amLODipine (NORVASC) 5 MG tablet Take 5 mg by mouth daily.    carvedilol (COREG) 12.5 MG tablet Take 12.5 mg by mouth 2 (two) times daily with a meal.    diphenhydrAMINE (SOMINEX) 25 MG tablet Take 25 mg by mouth at bedtime as needed.    lisinopril (PRINIVIL,ZESTRIL) 40 MG tablet Take 40 mg by mouth daily.    methotrexate (RHEUMATREX) 2.5 MG tablet Take 20 mg by mouth once a week. Patient takes 8 tabs (20 mg) on Monday. Caution:Chemotherapy. Protect from light.    pantoprazole (PROTONIX) 40 MG tablet Take 1 tablet (40 mg total) by mouth 2 (two) times daily before a meal. Qty: 60 tablet, Refills: 3    Skin Protectants, Misc. (EUCERIN) cream Apply topically as needed.    triamterene-hydrochlorothiazide (MAXZIDE) 75-50 MG per tablet Take 1 tablet by mouth daily.       No Known Allergies Follow-up Information    Follow up with follow up with provider at facility 1 week. recommend BMET and CBC to track kidney function and platelet count.       The results of significant diagnostics from this hospitalization (including imaging, microbiology, ancillary and laboratory) are listed below for reference.    Significant Diagnostic Studies: Dg Chest 2 View  06/26/2015   CLINICAL DATA:  Chest pain for 2 days. Cerebral infarction. Hypertension.  EXAM: CHEST  2 VIEW  COMPARISON:  None.  FINDINGS: The heart size and mediastinal contours are within normal limits. Both lungs are clear. The visualized skeletal structures are unremarkable.  IMPRESSION: No active cardiopulmonary disease.   Electronically  Signed   By: Myles Rosenthal M.D.   On: 06/26/2015 10:57   Ct Head Wo Contrast  06/26/2015   CLINICAL DATA:  Left arm and leg numbness. Symptoms for 2 days. Stroke.  EXAM: CT HEAD WITHOUT CONTRAST  TECHNIQUE: Contiguous axial images were obtained from the base of the skull through the vertex without intravenous contrast.  COMPARISON:  None.  FINDINGS: Small subcentimeter lacunar infarct right caudate. No territorial infarct. No intracranial hemorrhage, mass effect, or midline shift. No hydrocephalus. The basilar cisterns are patent. No intracranial fluid collection. Calvarium is intact. Included paranasal sinuses and mastoid air cells are well aerated.  IMPRESSION: Subcentimeter lacunar infarct right caudate, may be acute. No intracranial hemorrhage.   Electronically Signed   By: Rubye Oaks M.D.   On: 06/26/2015 04:17   Mr Maxine Glenn Head Wo Contrast  06/27/2015   CLINICAL DATA:  Stroke.  Left-sided numbness  EXAM: MRI HEAD WITHOUT CONTRAST  MRA HEAD WITHOUT CONTRAST  TECHNIQUE: Multiplanar, multiecho pulse sequences of the brain and surrounding structures were obtained without intravenous contrast. Angiographic images of the head were obtained using MRA technique without contrast.  COMPARISON:  CT head 06/26/2015  FINDINGS: MRI HEAD FINDINGS  Acute infarct right centrum semiovale and external capsule. No other areas of acute infarct.  Multiple small white matter hyperintensities bilaterally consistent with chronic microvascular ischemia, moderately advanced for age. Brainstem intact.  Negative for hemorrhage or mass lesion. No edema or shift of the midline structures.  Pituitary normal in size.  Cervical medullary junction normal.  MRA HEAD FINDINGS  Both vertebral arteries patent to the basilar. Left vertebral artery patent. PICA patent bilaterally. Basilar tortuous but widely patent. Superior cerebellar and posterior cerebral arteries patent bilaterally. Posterior communicating artery patent bilaterally.   Internal carotid artery patent bilaterally without stenosis. Anterior and middle cerebral arteries are normal.  Negative for cerebral aneurysm.  IMPRESSION: Acute infarct deep white matter on the right.  Moderate chronic microvascular ischemic change in the white matter  Negative MRA head.   Electronically Signed   By: Marlan Palau M.D.   On: 06/27/2015 08:46   Mr Brain Wo Contrast  06/27/2015   CLINICAL DATA:  Stroke.  Left-sided numbness  EXAM: MRI HEAD WITHOUT CONTRAST  MRA HEAD WITHOUT CONTRAST  TECHNIQUE: Multiplanar, multiecho pulse sequences of the brain and surrounding structures were obtained without intravenous contrast. Angiographic images of the head were obtained using MRA technique without contrast.  COMPARISON:  CT head 06/26/2015  FINDINGS: MRI HEAD FINDINGS  Acute infarct right centrum semiovale and external capsule. No other areas of acute infarct.  Multiple small white matter hyperintensities bilaterally consistent with chronic microvascular ischemia, moderately advanced for age. Brainstem intact.  Negative for hemorrhage or mass lesion. No edema or shift of the midline structures.  Pituitary normal in size.  Cervical medullary junction normal.  MRA HEAD FINDINGS  Both vertebral arteries patent to the basilar. Left vertebral artery patent. PICA patent bilaterally. Basilar tortuous but widely patent. Superior cerebellar and posterior cerebral arteries patent bilaterally. Posterior communicating artery patent bilaterally.  Internal carotid artery patent bilaterally without stenosis. Anterior and middle cerebral arteries are normal.  Negative for cerebral aneurysm.  IMPRESSION: Acute infarct deep white matter on the right.  Moderate chronic microvascular ischemic change in the white matter  Negative MRA head.   Electronically Signed   By: Marlan Palau M.D.   On: 06/27/2015 08:46   US Carotid Bilateral  06/26/2015   CLINICAL DATA:  Stroke. Left-sided weakness and numbness involving the arm  and left leg for the past 3 days.  EXAM: BILATERAL CAROTID DUPLEX ULTRASOUND  TECHNIQUE: Wallace Cullens scale imaging, color Doppler and duplex ultrasound were performed of bilateral carotid and vertebral arteries in the neck.  COMPARISON:  Head CT - 06/26/2015  FINDINGS: Criteria: Quantification of carotid stenosis is based on velocity parameters that correlate the residual internal carotid diameter with NASCET-based stenosis levels, using the diameter of the distal internal carotid lumen as the denominator for stenosis measurement.  The following velocity measurements were obtained:  RIGHT  ICA:  50/25 cm/sec  CCA:  85/15 cm/sec  SYSTOLIC ICA/CCA RATIO:  0.6  DIASTOLIC ICA/CCA RATIO:  1.3  ECA:  106 cm/sec  LEFT  ICA:  54/22 cm/sec  CCA:  92/23 cm/sec  SYSTOLIC ICA/CCA RATIO:  0.6  DIASTOLIC ICA/CCA RATIO:  1.0  ECA:  100 cm/sec  RIGHT CAROTID ARTERY: There is no grayscale evidence of significant intimal thickening or atherosclerotic plaque affecting the interrogated portions of the right carotid system. There are no elevated peak systolic velocities within the interrogated course the right internal carotid artery to suggest a hemodynamically significant stenosis.  RIGHT VERTEBRAL ARTERY:  Antegrade flow  LEFT CAROTID ARTERY: There is a very minimal amount of eccentric mixed echogenic plaque within the  distal aspect of the left common carotid artery (representative images 47 and 48), extending to involve the left carotid bulb (images 51 and 53), not resulting in elevated peak systolic velocities within the interrogated course of the left internal carotid artery to suggest a hemodynamically significant stenosis  LEFT VERTEBRAL ARTERY:  Antegrade flow  IMPRESSION: 1. Very minimal amount of left-sided intimal thickening and atherosclerotic plaque, not resulting in a hemodynamically significant stenosis 2. Normal sonographic evaluation of the right carotid system.   Electronically Signed   By: Simonne Come M.D.   On: 06/26/2015  12:10    Microbiology: Recent Results (from the past 240 hour(s))  MRSA PCR Screening     Status: Abnormal   Collection Time: 06/26/15  6:03 AM  Result Value Ref Range Status   MRSA by PCR POSITIVE (A) NEGATIVE Final    Comment:        The GeneXpert MRSA Assay (FDA approved for NASAL specimens only), is one component of a comprehensive MRSA colonization surveillance program. It is not intended to diagnose MRSA infection nor to guide or monitor treatment for MRSA infections. CRITICAL RESULT CALLED TO, READ BACK BY AND VERIFIED WITH: [name] AT 1610 ON 06/26/2015 BY WOODS, M      Labs: Basic Metabolic Panel:  Recent Labs Lab 06/26/15 0250 06/26/15 0649 06/27/15 0625  NA 139  --  138  K 3.1*  --  3.4*  CL 101  --  107  CO2 28  --  25  GLUCOSE 130*  --  113*  BUN 14  --  15  CREATININE 1.46* 1.31* 1.29*  CALCIUM 9.1  --  8.7*   Liver Function Tests:  Recent Labs Lab 06/26/15 0250  AST 27  ALT 17  ALKPHOS 91  BILITOT 1.0  PROT 8.3*  ALBUMIN 4.1   No results for input(s): LIPASE, AMYLASE in the last 168 hours. No results for input(s): AMMONIA in the last 168 hours. CBC:  Recent Labs Lab 06/26/15 0250 06/26/15 0649 06/27/15 0625  WBC 4.7 4.8 4.5  NEUTROABS 2.3  --   --   HGB 14.8 14.9 14.9  HCT 43.2 43.3 44.2  MCV 79.6 79.3 80.4  PLT 102* 107* 82*   Cardiac Enzymes:  Recent Labs Lab 06/26/15 0250 06/26/15 0649  TROPONINI <0.03 <0.03   BNP: BNP (last 3 results) No results for input(s): BNP in the last 8760 hours.  ProBNP (last 3 results) No results for input(s): PROBNP in the last 8760 hours.  CBG: No results for input(s): GLUCAP in the last 168 hours.     SignedGwenyth Bender  [facility] 06/27/2015, 9:41 AM

## 2015-06-27 NOTE — Care Management Note (Signed)
Case Management Note  Patient Details  Name: [name] [name] MRN: [id number] Date of Birth: 1966/06/04  Subjective/Objective:                  Pt admitted from [facility] with CVA. Anticipate pt will discharge back to facility at discharge.  Action/Plan: Will call prison RN at discharge to verify that pt can return to same facility.  Expected Discharge Date:     06/28/15             Expected Discharge Plan:  [facility]  In-House Referral:  NA  Discharge planning Services  CM Consult  Post Acute Care Choice:  NA Choice offered to:  NA  DME Arranged:    DME Agency:     HH Arranged:    HH Agency:     Status of Service:  Completed, signed off  Medicare Important Message Given:    Date Medicare IM Given:    Medicare IM give by:    Date Additional Medicare IM Given:    Additional Medicare Important Message give by:     If discussed at Long Length of Stay Meetings, dates discussed:    Additional Comments:  Cheryl Flash, RN 06/27/2015, 11:32 AM

## 2015-06-27 NOTE — Evaluation (Signed)
Physical Therapy Evaluation Patient Details Name: [name] [name] MRN: [id number] DOB: September 23, 1966 Today's Date: 06/27/2015   History of Present Illness  49 year old male who   has a past medical history of Hypertension; Gout; and Psoriasis. Today was brought to the hospital from correctional facility after patient complained of chest pain and left-sided numbness. Patient says that he developed numbness of left arm and leg on Friday and they have continued to be numb. At that time he also had chest pain which lasted for about 5 minutes. He denies chest pain at this time. Patient also noticed some weakness of the left side, but denies any blurred vision. Denies headache. No previous history of stroke. He has a history of hypertension.  Clinical Impression  Pt was seen for evaluation.  He is currently a prisoner so 2 guards were present and pt shackled to the bed.  We completed strength and coordination evaluation finding normal strength bilaterally in LEs.  BP was taken and found to be 214/115 so PT was stopped and RN/MD alerted.  He was medicated and BP returned to WNL so I returned to complete the evaluation.  He is independent in transfers and gait is stable with no assistive device.  He does have a mild deficit in high level balance activities which reduce his base of support.  I anticipate that this will resolve completely as it is very mild but I have instructed pt in some exercises that he can work on independently.  He understands teaching and is able to teach back.  I have asked him to see his facility MD if this balance problem does not resolve (after a month or so) and PT can then be ordered.  On a brief look at UE strength/coordination, his LUE strength appears to be close to normal but has mild deficit in coordination.  I am hopeful that this will resolve as well.    Follow Up Recommendations No PT follow up    Equipment Recommendations  None recommended by PT    Recommendations for Other  Services       Precautions / Restrictions Precautions Precautions: None Restrictions Weight Bearing Restrictions: No      Mobility  Bed Mobility Overal bed mobility: Independent                Transfers Overall transfer level: Independent                  Ambulation/Gait Ambulation/Gait assistance: Independent Ambulation Distance (Feet): 60 Feet (pt ambulated in the room so as not to have to wear shackles on his legs) Assistive device: None Gait Pattern/deviations: WFL(Within Functional Limits)   Gait velocity interpretation: >2.62 ft/sec, indicative of independent Company secretary Rankin (Stroke Patients Only) Modified Rankin (Stroke Patients Only) Pre-Morbid Rankin Score: No symptoms Modified Rankin: No significant disability     Balance Overall balance assessment: Modified Independent (mild deficit in high level balance activities..single leg and tandem stance, toes raise)                                           Pertinent Vitals/Pain Pain Assessment: No/denies pain    Home Living Family/patient expects to be discharged to:: [facility]ison  Prior Function Level of Independence: Independent               Hand Dominance        Extremity/Trunk Assessment   Upper Extremity Assessment:  (OT may not be able to see pt today..he has good strength but mild decrease in coordination of LUE noted)           Lower Extremity Assessment: Overall WFL for tasks assessed         Communication   Communication: No difficulties  Cognition Arousal/Alertness: Awake/alert Behavior During Therapy: WFL for tasks assessed/performed Overall Cognitive Status: Within Functional Limits for tasks assessed                      General Comments      Exercises        Assessment/Plan    PT Assessment Patent does not need any  further PT services  PT Diagnosis     PT Problem List    PT Treatment Interventions     PT Goals (Current goals can be found in the Care Plan section) Acute Rehab PT Goals PT Goal Formulation: All assessment and education complete, DC therapy    Frequency     Barriers to discharge        Co-evaluation               End of Session Equipment Utilized During Treatment: Gait belt Activity Tolerance: Patient tolerated treatment well Patient left: in bed;with call bell/phone within reach;with family/visitor present;with restraints reapplied Nurse Communication: Mobility status         Time: 4540-9811 PT Time Calculation (min) (ACUTE ONLY): 19 min   Charges:   PT Evaluation $Initial PT Evaluation Tier I: 1 Procedure PT Treatments $Therapeutic Exercise: 8-22 mins   PT G CodesKonrad Penta  PT 06/27/2015, 11:35 AM [phone]

## 2015-06-27 NOTE — Evaluation (Signed)
Occupational Therapy Evaluation Patient Details Name: [name] [name] MRN: [id number] DOB: [birth year] Today's Date: 06/27/2015    History of Present Illness     Clinical Impression   Patient is a 49 year old male previously residing at [facility].  Presents with LUE minimal deficits in [facility], affecting his ability to open packages and containers as he normally would do so.  OT educated patient on HEP for [facility] training, [facility] training, and proximal shoulder strengthening.  Patient demonstrated Independence with these exercises.      Follow Up Recommendations  No OT follow up (recommended HEP as outlined above and f/u with RN at facility if symptoms do not resolve)    Equipment Recommendations       Recommendations for Other Services       Precautions / Restrictions Precautions Precautions: None Restrictions Weight Bearing Restrictions: No              ADL                                         General ADL Comments: difficulty opening packages on lunch tray this date.      Vision     Perception Perception Perception Tested?: No   Praxis Praxis Praxis tested?: Not tested    Pertinent Vitals/Pain Pain Assessment: No/denies pain     Hand Dominance Right   Extremity/Trunk Assessment Upper Extremity Assessment Upper Extremity Assessment: LUE deficits/detail LUE Deficits / Details: LUE AROM is WFL, however left arm movement not as fluid and as rapid as his RUE, light touch sensation is WFL LUE Coordination: decreased fine motor;decreased gross motor (nose to finger test slower and less accurate on left side)   Lower Extremity Assessment Lower Extremity Assessment: Defer to PT evaluation       Communication Communication Communication: No difficulties   Cognition Arousal/Alertness: Awake/alert Behavior During Therapy: WFL for tasks assessed/performed Overall Cognitive Status: Within Functional Limits for tasks  assessed                     General Comments       Exercises   Other Exercises Other Exercises: educated on shoulder proximal strengthening exercises for improved bilateral coordination and symmetry of movements.  Recommended digit opposition, snapping, and abduction/adduction to improve fine motor coordination deficits.   Shoulder Instructions      Home Living Family/patient expects to be discharged to:: [facility]ison                                        Prior Functioning/Environment Level of Independence: Independent             OT Diagnosis: Other (comment) (decreased LUE coordination)   OT Problem List: Decreased coordination   OT Treatment/Interventions:      OT Goals(Current goals can be found in the care plan section)    OT Frequency:     Barriers to D/C:            Co-evaluation              End of Session    Activity Tolerance: Patient tolerated treatment well Patient left: in bed;with call bell/phone within reach;Other (comment) (guards in room)   Time: 5284-1324 OT Time Calculation (min): 15 min Charges:  OT  General Charges $OT Visit: 1 Procedure OT Evaluation $Initial OT Evaluation Tier I: 1 Procedure OT Treatments $Self Care/Home Management : 8-22 mins G-Codes:    Shirlean Mylar, OTR/L [phone]  06/27/2015, 3:41 PM

## 2015-06-28 DIAGNOSIS — I638 Other cerebral infarction: Secondary | ICD-10-CM

## 2015-06-28 LAB — CBC
HCT: 44.8 % (ref 39.0–52.0)
Hemoglobin: 15.3 g/dL (ref 13.0–17.0)
MCH: 27.2 pg (ref 26.0–34.0)
MCHC: 34.2 g/dL (ref 30.0–36.0)
MCV: 79.6 fL (ref 78.0–100.0)
PLATELETS: 104 10*3/uL — AB (ref 150–400)
RBC: 5.63 MIL/uL (ref 4.22–5.81)
RDW: 14.3 % (ref 11.5–15.5)
WBC: 6.7 10*3/uL (ref 4.0–10.5)

## 2015-06-28 LAB — FOLATE RBC
FOLATE, HEMOLYSATE: 352.6 ng/mL
Folate, RBC: 732 ng/mL (ref >498)
Hematocrit: 48.2 % (ref 37.5–51.0)

## 2015-06-28 LAB — BASIC METABOLIC PANEL
Anion gap: 5 (ref 5–15)
BUN: 22 mg/dL — ABNORMAL HIGH (ref 6–20)
CALCIUM: 9.1 mg/dL (ref 8.9–10.3)
CO2: 27 mmol/L (ref 22–32)
Chloride: 105 mmol/L (ref 101–111)
Creatinine, Ser: 1.5 mg/dL — ABNORMAL HIGH (ref 0.61–1.24)
GFR calc Af Amer: >60 mL/min (ref >60)
GFR calc non Af Amer: 54 mL/min — ABNORMAL LOW (ref >60)
GLUCOSE: 108 mg/dL — AB (ref 65–99)
POTASSIUM: 3.9 mmol/L (ref 3.5–5.1)
SODIUM: 137 mmol/L (ref 135–145)

## 2015-06-28 LAB — HIV ANTIBODY (ROUTINE TESTING W REFLEX): HIV Screen 4th Generation wRfx: NONREACTIVE

## 2015-06-28 LAB — HEMOGLOBIN A1C
Hgb A1c MFr Bld: 6.1 % — ABNORMAL HIGH (ref 4.8–5.6)
MEAN PLASMA GLUCOSE: 128 mg/dL

## 2015-06-28 LAB — HEPATITIS C ANTIBODY

## 2015-06-28 MED ORDER — ATORVASTATIN CALCIUM 20 MG PO TABS: 20.0000 mg | ORAL_TABLET | Freq: Every day | ORAL | Status: AC

## 2015-06-28 NOTE — Discharge Planning (Signed)
Pt IV removed. Pt given DC papers, explained and educated.  Told of needed FU appts and given scripts.  VSS and RN assessment revealed stability for discharge.  Pt was wheeled to car and taken home via prison staff in car.

## 2015-06-28 NOTE — Discharge Summary (Signed)
Physician Discharge Summary  [name] [name] ZOX:[id number] DOB: 1966/01/19 DOA: 06/26/2015  PCP: No primary care provider on file.  Admit date: 06/26/2015 Discharge date: 06/28/2015  Time spent: 40 minutes  Recommendations for Outpatient Follow-up:  1. Will need CBC and BMET in 1 week to track platelets and creatinine   Discharge Diagnoses:  Principal Problem:   CVA (cerebral infarction) Active Problems:   HTN (hypertension)   Stroke   Essential hypertension   Hypokalemia   Acute renal failure   Thrombocytopenia   Discharge Condition: stable  Diet recommendation: heart healthy  Filed Weights   06/26/15 0209 06/26/15 0630  Weight: 95.709 kg (211 lb) 89.6 kg (197 lb 8.5 oz)    History of present illness:  49 year old male with past medical history of Hypertension; Gout; and Psoriasis was brought to the hospital on [facility]/15 from [facility] after patient complained of chest pain and left-sided numbness. Patient reported that he developed numbness of left arm and leg 2 days prior and they continued to be numb. At that time he also had chest pain which lasted for about 5 minutes. He denied chest pain on presentation. Patient also noticed some weakness of the left side, but denied any blurred vision. Denied headache. No previous history of stroke. He has a history of hypertension. In the ED CT head was done which showed subcentimeter lacunar infarct right caudate, may be acute and no intracranial hemorrhage. Since patient was last normal on Friday, out of window for TPA. Hospital Course:   CVA. Patient presented to the hospital with left side numbness. CT head in ED showed sub centimeter lacunar infarct. MRI head reveal acute infarct deep white matter on right, chronic microvascular ischemia, MRA negative, [name] dopplers with very minimal left sided thickening not resulting in hemodyamically significant stenosis and normal on right and echocardiogram with EF 60-65% and  grade 1 diastolic dysfunction, lipid panel HDL 34 and LDL 114 otherwise within limits of norma. Hemoglobin A1C mildly elevated at 6.1. Would recommend dietary modifications. PT and OT evaluations have also been ordered. Continue pt on aspirin.   Hypertension. Patient was remarkably hypertensive on admission with a bp of 214/130. Blood pressure improved and home antihypertensive medications held for first 24 hours to allow for permissive hypertension. Poor control today. Restart home regimen of antihypertensives except for HCTZ secondary to #6. Will monitor and continue prn hydralazine.   Thrombocytopenia. Etiology is unclear but maybe related to severe hypertension in setting of methotrexate use. Work up includine HIV, HCV, B12 and folate was unremarkable. No signs of bleeding. Chart review indicates platelet count within limits of normal 2 years ago. Patient in prison for last 22 years. Will need close OP follow up.Platelet count currently stable   Hypokalemia. Likely related to maxide. Replaced potassium.. Recommend close OP follow up  GERD. Stable at baseline Continue Protonix Acute renal failure: chart review indicates 2 years ago creatinine within normal limits. Creatine 1.46 on admission. Likely related to #2 and decreased oral intake in setting of ACE I and HCTZ. He may have an element of CKD with his severe hypertension. Will continue ace inhibitor and hold maxide for now. Will need labs within a week.   Procedures:  Echo: There was moderate concentric hypertrophy. Systolic function was normal. The estimated ejection fraction was in the range of 60% to 65%. Wall motion was normal; there were no regional wall motion abnormalities. Doppler parameters are consistent with abnormal left ventricular relaxation (grade 1 diastolic dysfunction  Consultations:  none  Discharge Exam: Filed Vitals:   06/28/15 1047  BP: 128/84  Pulse: 72  Temp:   Resp:     General:  well nourished Cardiovascular: RRR no MGR no no LE edema Respiratory: normal effort BS clear bilaterally  Discharge Instructions   Discharge Instructions    Diet - low sodium heart healthy    Complete by:  As directed      Increase activity slowly    Complete by:  As directed           Current Discharge Medication List    START taking these medications   Details  aspirin EC 81 MG tablet Take 1 tablet (81 mg total) by mouth daily.    atorvastatin (LIPITOR) 20 MG tablet Take 1 tablet (20 mg total) by mouth daily at 6 PM. Qty: 30 tablet, Refills: 1      CONTINUE these medications which have NOT CHANGED   Details  amLODipine (NORVASC) 5 MG tablet Take 5 mg by mouth daily.    carvedilol (COREG) 12.5 MG tablet Take 12.5 mg by mouth 2 (two) times daily with a meal.    diphenhydrAMINE (SOMINEX) 25 MG tablet Take 25 mg by mouth at bedtime as needed.    lisinopril (PRINIVIL,ZESTRIL) 40 MG tablet Take 40 mg by mouth daily.    methotrexate (RHEUMATREX) 2.5 MG tablet Take 20 mg by mouth once a week. Patient takes 8 tabs (20 mg) on Monday. Caution:Chemotherapy. Protect from light.    pantoprazole (PROTONIX) 40 MG tablet Take 1 tablet (40 mg total) by mouth 2 (two) times daily before a meal. Qty: 60 tablet, Refills: 3    Skin Protectants, Misc. (EUCERIN) cream Apply topically as needed.      STOP taking these medications     triamterene-hydrochlorothiazide (MAXZIDE) 75-50 MG per tablet        No Known Allergies Follow-up Information    Follow up with follow up with provider at facility 1 week. recommend BMET and CBC to track kidney function and platelet count.       The results of significant diagnostics from this hospitalization (including imaging, microbiology, ancillary and laboratory) are listed below for reference.    Significant Diagnostic Studies: Dg Chest 2 View  06/26/2015   CLINICAL DATA:  Chest pain for 2 days. Cerebral infarction. Hypertension.  EXAM: CHEST  2  VIEW  COMPARISON:  None.  FINDINGS: The heart size and mediastinal contours are within normal limits. Both lungs are clear. The visualized skeletal structures are unremarkable.  IMPRESSION: No active cardiopulmonary disease.   Electronically Signed   By: Myles Rosenthal M.D.   On: 06/26/2015 10:57   Ct Head [name] Contrast  06/26/2015   CLINICAL DATA:  Left arm and leg numbness. Symptoms for 2 days. Stroke.  EXAM: CT HEAD WITHOUT CONTRAST  TECHNIQUE: Contiguous axial images were obtained from the base of the skull through the vertex without intravenous contrast.  COMPARISON:  None.  FINDINGS: Small subcentimeter lacunar infarct right caudate. No territorial infarct. No intracranial hemorrhage, mass effect, or midline shift. No hydrocephalus. The basilar cisterns are patent. No intracranial fluid collection. Calvarium is intact. Included paranasal sinuses and mastoid air cells are well aerated.  IMPRESSION: Subcentimeter lacunar infarct right caudate, may be acute. No intracranial hemorrhage.   Electronically Signed   By: Rubye Oaks M.D.   On: 06/26/2015 04:17   Mr [name] [name] Head [name] Contrast  06/27/2015   CLINICAL DATA:  Stroke.  Left-sided numbness  EXAM: MRI HEAD WITHOUT CONTRAST  MRA HEAD WITHOUT CONTRAST  TECHNIQUE: Multiplanar, multiecho pulse sequences of the [name] and surrounding structures were obtained without intravenous contrast. Angiographic images of the head were obtained using MRA technique without contrast.  COMPARISON:  CT head 06/26/2015  FINDINGS: MRI HEAD FINDINGS  Acute infarct right centrum semiovale and external capsule. No other areas of acute infarct.  Multiple small white matter hyperintensities bilaterally consistent with chronic microvascular ischemia, moderately advanced for age. Brainstem intact.  Negative for hemorrhage or mass lesion. No edema or shift of the midline structures.  Pituitary normal in size.  Cervical medullary junction normal.  MRA HEAD FINDINGS  Both vertebral arteries  patent to the basilar. Left vertebral artery patent. PICA patent bilaterally. Basilar tortuous but widely patent. Superior cerebellar and posterior cerebral arteries patent bilaterally. Posterior communicating artery patent bilaterally.  Internal [name] artery patent bilaterally without stenosis. Anterior and middle cerebral arteries are normal.  Negative for cerebral aneurysm.  IMPRESSION: Acute infarct deep white matter on the right.  Moderate chronic microvascular ischemic change in the white matter  Negative MRA head.   Electronically Signed   By: Marlan Palau M.D.   On: 06/27/2015 08:46   Mr [name] [name] Contrast  06/27/2015   CLINICAL DATA:  Stroke.  Left-sided numbness  EXAM: MRI HEAD WITHOUT CONTRAST  MRA HEAD WITHOUT CONTRAST  TECHNIQUE: Multiplanar, multiecho pulse sequences of the [name] and surrounding structures were obtained without intravenous contrast. Angiographic images of the head were obtained using MRA technique without contrast.  COMPARISON:  CT head 06/26/2015  FINDINGS: MRI HEAD FINDINGS  Acute infarct right centrum semiovale and external capsule. No other areas of acute infarct.  Multiple small white matter hyperintensities bilaterally consistent with chronic microvascular ischemia, moderately advanced for age. Brainstem intact.  Negative for hemorrhage or mass lesion. No edema or shift of the midline structures.  Pituitary normal in size.  Cervical medullary junction normal.  MRA HEAD FINDINGS  Both vertebral arteries patent to the basilar. Left vertebral artery patent. PICA patent bilaterally. Basilar tortuous but widely patent. Superior cerebellar and posterior cerebral arteries patent bilaterally. Posterior communicating artery patent bilaterally.  Internal [name] artery patent bilaterally without stenosis. Anterior and middle cerebral arteries are normal.  Negative for cerebral aneurysm.  IMPRESSION: Acute infarct deep white matter on the right.  Moderate chronic microvascular ischemic  change in the white matter  Negative MRA head.   Electronically Signed   By: Marlan Palau M.D.   On: 06/27/2015 08:46   [name] [name] Bilateral  06/26/2015   CLINICAL DATA:  Stroke. Left-sided weakness and numbness involving the arm and left leg for the past 3 days.  EXAM: BILATERAL [name] DUPLEX ULTRASOUND  TECHNIQUE: Wallace Cullens scale imaging, color Doppler and duplex ultrasound were performed of bilateral [name] and vertebral arteries in the neck.  COMPARISON:  Head CT - 06/26/2015  FINDINGS: Criteria: Quantification of [name] stenosis is based on velocity parameters that correlate the residual internal [name] diameter with NASCET-based stenosis levels, using the diameter of the distal internal [name] lumen as the denominator for stenosis measurement.  The following velocity measurements were obtained:  RIGHT  ICA:  50/25 cm/sec  CCA:  85/15 cm/sec  SYSTOLIC ICA/CCA RATIO:  0.6  DIASTOLIC ICA/CCA RATIO:  1.3  ECA:  106 cm/sec  LEFT  ICA:  54/22 cm/sec  CCA:  92/23 cm/sec  SYSTOLIC ICA/CCA RATIO:  0.6  DIASTOLIC ICA/CCA RATIO:  1.0  ECA:  100 cm/sec  RIGHT [name] ARTERY: There is no grayscale evidence of significant  intimal thickening or atherosclerotic plaque affecting the interrogated portions of the right [name] system. There are no elevated peak systolic velocities within the interrogated course the right internal [name] artery to suggest a hemodynamically significant stenosis.  RIGHT VERTEBRAL ARTERY:  Antegrade flow  LEFT [name] ARTERY: There is a very minimal amount of eccentric mixed echogenic plaque within the distal aspect of the left common [name] artery (representative images 47 and 48), extending to involve the left [name] bulb (images 51 and 53), not resulting in elevated peak systolic velocities within the interrogated course of the left internal [name] artery to suggest a hemodynamically significant stenosis  LEFT VERTEBRAL ARTERY:  Antegrade flow  IMPRESSION: 1. Very minimal amount of  left-sided intimal thickening and atherosclerotic plaque, not resulting in a hemodynamically significant stenosis 2. Normal sonographic evaluation of the right [name] system.   Electronically Signed   By: Simonne Come M.D.   On: 06/26/2015 12:10    Microbiology: Recent Results (from the past 240 hour(s))  MRSA PCR Screening     Status: Abnormal   Collection Time: 06/26/15  6:03 AM  Result Value Ref Range Status   MRSA by PCR POSITIVE (A) NEGATIVE Final    Comment:        The GeneXpert MRSA Assay (FDA approved for NASAL specimens only), is one component of a comprehensive MRSA colonization surveillance program. It is not intended to diagnose MRSA infection nor to guide or monitor treatment for MRSA infections. CRITICAL RESULT CALLED TO, READ BACK BY AND VERIFIED WITH: [name] AT [id number] ON 06/26/2015 BY WOODS, M      Labs: Basic Metabolic Panel:  Recent Labs Lab 06/26/15 0250 06/26/15 0649 06/27/15 0625 06/28/15 0612  NA 139  --  138 137  K 3.1*  --  3.4* 3.9  CL 101  --  107 105  CO2 28  --  25 27  GLUCOSE 130*  --  113* 108*  BUN 14  --  15 22*  CREATININE 1.46* 1.31* 1.29* 1.50*  CALCIUM 9.1  --  8.7* 9.1   Liver Function Tests:  Recent Labs Lab 06/26/15 0250  AST 27  ALT 17  ALKPHOS 91  BILITOT 1.0  PROT 8.3*  ALBUMIN 4.1   No results for input(s): LIPASE, AMYLASE in the last 168 hours. No results for input(s): AMMONIA in the last 168 hours. CBC:  Recent Labs Lab 06/26/15 0250 06/26/15 0649 06/27/15 0625 06/28/15 0612  WBC 4.7 4.8 4.5 6.7  NEUTROABS 2.3  --   --   --   HGB 14.8 14.9 14.9 15.3  HCT 43.2 43.3 44.2 44.8  MCV 79.6 79.3 80.4 79.6  PLT 102* 107* 82* 104*   Cardiac Enzymes:  Recent Labs Lab 06/26/15 0250 06/26/15 0649  TROPONINI <0.03 <0.03   BNP: BNP (last 3 results) No results for input(s): BNP in the last 8760 hours.  ProBNP (last 3 results) No results for input(s): PROBNP in the last 8760 hours.  CBG: No results  for input(s): GLUCAP in the last 168 hours.     Signed:  Toya Smothers, NP  [facility] 06/28/2015, 11:12 AM  Attending note:  Patient seen and examined. Above note reviewed and amended. Patient admitted with CVA. Work up unremarkable. Suspect his CVA is related to severe hypertension. Started on aspirin. Stable for discharge today.  MEMON,[name] [name]

## 2015-06-28 NOTE — Plan of Care (Signed)
Problem: Acute Treatment Outcomes Goal: Neuro exam at baseline or improved Outcome: Completed/Met Date Met:  06/28/15 Patient has no complaints of visual changes or deficits related to stroke during assessments

## 2015-06-28 NOTE — Care Management Note (Signed)
Case Management Note  Patient Details  Name: [name] [name] MRN: [id number] Date of Birth: Jul 04, 1966  Subjective/Objective:                    Action/Plan:   Expected Discharge Date:                  Expected Discharge Plan:  [facility]  In-House Referral:  NA  Discharge planning Services  CM Consult  Post Acute Care Choice:  NA Choice offered to:  NA  DME Arranged:    DME Agency:     HH Arranged:    HH Agency:     Status of Service:  Completed, signed off  Medicare Important Message Given:    Date Medicare IM Given:    Medicare IM give by:    Date Additional Medicare IM Given:    Additional Medicare Important Message give by:     If discussed at Long Length of Stay Meetings, dates discussed:    Additional Comments: Pt discharged back to [facility]. CM spoke with [name] Asp RN at facility and she gave OK for pt to return. No further CM needs noted. Arlyss Queen [place], RN 06/28/2015, 11:31 AM
